# Patient Record
Sex: Male | Born: 1970 | Race: White | Hispanic: No | Marital: Single | Smoking: Current every day smoker
Health system: Southern US, Community
[De-identification: ages and names within clinical notes are randomized; demographics above are authoritative.]

## PROBLEM LIST (undated history)

## (undated) DIAGNOSIS — E669 Obesity, unspecified: Secondary | ICD-10-CM

## (undated) DIAGNOSIS — I10 Essential (primary) hypertension: Secondary | ICD-10-CM

## (undated) DIAGNOSIS — E119 Type 2 diabetes mellitus without complications: Secondary | ICD-10-CM

## (undated) DIAGNOSIS — E7800 Pure hypercholesterolemia, unspecified: Secondary | ICD-10-CM

---

## 1999-01-12 ENCOUNTER — Encounter: Payer: Self-pay | Admitting: Emergency Medicine

## 1999-01-12 ENCOUNTER — Emergency Department (HOSPITAL_COMMUNITY): Admission: EM | Admit: 1999-01-12 | Discharge: 1999-01-12 | Payer: Self-pay | Admitting: Emergency Medicine

## 2003-10-09 ENCOUNTER — Emergency Department (HOSPITAL_COMMUNITY): Admission: EM | Admit: 2003-10-09 | Discharge: 2003-10-09 | Payer: Self-pay

## 2004-07-02 ENCOUNTER — Emergency Department (HOSPITAL_COMMUNITY): Admission: EM | Admit: 2004-07-02 | Discharge: 2004-07-02 | Payer: Self-pay | Admitting: Emergency Medicine

## 2005-04-06 ENCOUNTER — Emergency Department (HOSPITAL_COMMUNITY): Admission: EM | Admit: 2005-04-06 | Discharge: 2005-04-06 | Payer: Self-pay | Admitting: Emergency Medicine

## 2010-12-02 ENCOUNTER — Inpatient Hospital Stay (INDEPENDENT_AMBULATORY_CARE_PROVIDER_SITE_OTHER)
Admission: RE | Admit: 2010-12-02 | Discharge: 2010-12-02 | Disposition: A | Discharge status: Home / Self Care | Payer: Self-pay | Source: Ambulatory Visit | Attending: Emergency Medicine | Admitting: Emergency Medicine

## 2010-12-02 ENCOUNTER — Ambulatory Visit (INDEPENDENT_AMBULATORY_CARE_PROVIDER_SITE_OTHER): Payer: Self-pay

## 2010-12-02 DIAGNOSIS — S82899A Other fracture of unspecified lower leg, initial encounter for closed fracture: Secondary | ICD-10-CM

## 2013-09-28 ENCOUNTER — Emergency Department (HOSPITAL_COMMUNITY)
Admission: EM | Admit: 2013-09-28 | Discharge: 2013-09-28 | Disposition: A | Discharge status: Home / Self Care | Payer: Self-pay | Attending: Emergency Medicine | Admitting: Emergency Medicine

## 2013-09-28 ENCOUNTER — Emergency Department (HOSPITAL_COMMUNITY): Payer: Self-pay

## 2013-09-28 ENCOUNTER — Encounter (HOSPITAL_COMMUNITY): Payer: Self-pay | Admitting: Emergency Medicine

## 2013-09-28 DIAGNOSIS — G4710 Hypersomnia, unspecified: Secondary | ICD-10-CM | POA: Insufficient documentation

## 2013-09-28 DIAGNOSIS — R55 Syncope and collapse: Secondary | ICD-10-CM | POA: Insufficient documentation

## 2013-09-28 DIAGNOSIS — I10 Essential (primary) hypertension: Secondary | ICD-10-CM | POA: Insufficient documentation

## 2013-09-28 DIAGNOSIS — E663 Overweight: Secondary | ICD-10-CM | POA: Insufficient documentation

## 2013-09-28 DIAGNOSIS — F17200 Nicotine dependence, unspecified, uncomplicated: Secondary | ICD-10-CM | POA: Insufficient documentation

## 2013-09-28 HISTORY — DX: Essential (primary) hypertension: I10

## 2013-09-28 LAB — I-STAT CHEM 8, ED
BUN: 15 mg/dL (ref 6–23)
CHLORIDE: 99 meq/L (ref 96–112)
Calcium, Ion: 1.2 mmol/L (ref 1.12–1.23)
Creatinine, Ser: 1.1 mg/dL (ref 0.50–1.35)
Glucose, Bld: 125 mg/dL — ABNORMAL HIGH (ref 70–99)
HEMATOCRIT: 49 % (ref 39.0–52.0)
HEMOGLOBIN: 16.7 g/dL (ref 13.0–17.0)
Potassium: 4.3 mEq/L (ref 3.7–5.3)
Sodium: 139 mEq/L (ref 137–147)
TCO2: 30 mmol/L (ref 0–100)

## 2013-09-28 LAB — PRO B NATRIURETIC PEPTIDE: Pro B Natriuretic peptide (BNP): 45.2 pg/mL (ref 0–125)

## 2013-09-28 LAB — I-STAT TROPONIN, ED: TROPONIN I, POC: 0 ng/mL (ref 0.00–0.08)

## 2013-09-28 MED ORDER — LISINOPRIL 10 MG PO TABS
10.0000 mg | ORAL_TABLET | Freq: Every day | ORAL | Status: DC
Start: 2013-09-28 — End: 2016-03-21

## 2013-09-28 NOTE — ED Notes (Signed)
Over past several weeks pt is having episodes of falling asleep, happening several times a day.  Last episode yesterday.  Pt sleeping well at night, roommates tells him that they hear him gasping for breaths.  No injuries from falling asleep.  Pt states he has gained weight over that past several years.  No other s/s noted.

## 2013-09-28 NOTE — ED Provider Notes (Signed)
CSN: [id number]     Arrival date & time 09/28/13  [id number] History   First MD Initiated Contact with Patient 09/28/13 1107     Chief Complaint  Patient presents with  . Loss of Consciousness     (Consider location/radiation/quality/duration/timing/severity/associated sxs/prior Treatment) HPI Comments: The patient presents to the ER for evaluation of somnolence. Patient reports that he has been frequently falling asleep during the day. He reports that when he is sitting at work or not doing anything in particular, he often falls asleep. This is unusual for him. He thinks he is sleeping well at night. He does report a history of significant weight gain over the last year or so after he had a leg injury. Has gained 75 pounds. He also reports a history of significant snoring, has never been evaluated for this. He is hypertensive, but has never been treated for it. He denies any specific shortness of breath or chest pain.  Patient is a 43 y.o. male presenting with syncope.  Loss of Consciousness Associated symptoms: weakness     Past Medical History  Diagnosis Date  . Hypertension    History reviewed. No pertinent past surgical history. History reviewed. No pertinent family history. History  Substance Use Topics  . Smoking status: Current Every Day Smoker -- 1.00 packs/day    Types: Cigarettes  . Smokeless tobacco: Not on file  . Alcohol Use: Yes    Review of Systems  Cardiovascular: Positive for syncope.  Neurological: Positive for weakness.  Psychiatric/Behavioral: Negative for sleep disturbance.  All other systems reviewed and are negative.     Allergies  Review of patient's allergies indicates no known allergies.  Home Medications   Prior to Admission medications   Not on File   BP 125/93  Pulse 66  Temp(Src) 97.9 F (36.6 C)  Resp 14  Wt 285 lb 3 oz (129.36 kg)  SpO2 92% Physical Exam  Constitutional: He is oriented to person, place, and time. He appears  well-developed and well-nourished. No distress.  HENT:  Head: Normocephalic and atraumatic.  Right Ear: Hearing normal.  Left Ear: Hearing normal.  Nose: Nose normal.  Mouth/Throat: Oropharynx is clear and moist and mucous membranes are normal.  Eyes: Conjunctivae and EOM are normal. Pupils are equal, round, and reactive to light.  Neck: Normal range of motion. Neck supple.  Cardiovascular: Regular rhythm, S1 normal and S2 normal.  Exam reveals no gallop and no friction rub.   No murmur heard. Pulmonary/Chest: Effort normal and breath sounds normal. No respiratory distress. He exhibits no tenderness.  Abdominal: Soft. Normal appearance and bowel sounds are normal. There is no hepatosplenomegaly. There is no tenderness. There is no rebound, no guarding, no tenderness at McBurney's point and negative Murphy's sign. No hernia.  Musculoskeletal: Normal range of motion.  Neurological: He is alert and oriented to person, place, and time. He has normal strength. No cranial nerve deficit or sensory deficit. Coordination normal. GCS eye subscore is 4. GCS verbal subscore is 5. GCS motor subscore is 6.  Skin: Skin is warm, dry and intact. No rash noted. No cyanosis.  Psychiatric: He has a normal mood and affect. His speech is normal and behavior is normal. Thought content normal.    ED Course  Procedures (including critical care time) Labs Review Labs Reviewed  I-STAT CHEM 8, ED - Abnormal; Notable for the following:    Glucose, Bld 125 (*)    All other components within normal limits  PRO B NATRIURETIC PEPTIDE  Rosezena SensorI-STAT TROPOININ, ED    Imaging Review Dg Chest 2 View  09/28/2013   CLINICAL DATA:  Altered mental status  EXAM: CHEST  2 VIEW  COMPARISON:  Oct 09, 2003  FINDINGS: There is no edema or consolidation. Heart size and pulmonary vascularity are normal. No adenopathy. Anterior wedging of the T6 vertebral body is a stable finding.  IMPRESSION: No edema or consolidation.   Electronically  Signed   By: Bretta BangWilliam  Woodruff M.D.   On: 09/28/2013 12:20     EKG Interpretation   Date/Time:  Saturday Sep 28 2013 09:54:32 EDT Ventricular Rate:  91 PR Interval:  128 QRS Duration: 88 QT Interval:  368 QTC Calculation: 452 R Axis:   68 Text Interpretation:  Normal sinus rhythm Normal ECG Confirmed by Kalisa Girtman   MD, Nykia Turko ([id number]) on 09/28/2013 11:24:19 AM      MDM   Final diagnoses:  Hypersomnolence  Syncope  Hypertension   Patient presents to the ER for evaluation of hypersomnolence. He has frequently been falling asleep over last couple of weeks. He does not have any other concomitant symptoms. He has a normal neurologic exam here in the ER. Cardiac workup was negative. Patient does have a large body habitus and admits to taking approximately 75 pounds over last year or so after a leg injury. Sleep apnea is suspected. As his workup here in the ER has been unremarkable other than his hypertension, he will be referred to neurology for further evaluation of hypersomnolence and possible syncope. He was given a prescription for lisinopril for his chronic hypertension and has been untreated. She was also given referral for primary care followup.    Gilda Creasehristopher J. Kijuan Gallicchio, MD 09/28/13 [phone]

## 2013-09-28 NOTE — ED Notes (Signed)
Per pt sts he has been falling asleep intermittently. 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 sts that this has happened every day for the past week.

## 2013-09-28 NOTE — Discharge Instructions (Signed)
Hypertension As your heart beats, it forces blood through your arteries. This force is your blood pressure. If the pressure is too high, it is called hypertension (HTN) or high blood pressure. HTN is dangerous because you may have it and not know it. High blood pressure may mean that your heart has to work harder to pump blood. Your arteries may be narrow or stiff. The extra work puts you at risk for heart disease, stroke, and other problems.  Blood pressure consists of two numbers, a higher number over a lower, 110/72, for example. It is stated as "110 over 72." The ideal is below 120 for the top number (systolic) and under 80 for the bottom (diastolic). Write down your blood pressure today. You should pay close attention to your blood pressure if you have certain conditions such as:  Heart failure.  Prior heart attack.  Diabetes  Chronic kidney disease.  Prior stroke.  Multiple risk factors for heart disease. To see if you have HTN, your blood pressure should be measured while you are seated with your arm held at the level of the heart. It should be measured at least twice. A one-time elevated blood pressure reading (especially in the Emergency Department) does not mean that you need treatment. There may be conditions in which the blood pressure is different between your right and left arms. It is important to see your caregiver soon for a recheck. Most people have essential hypertension which means that there is not a specific cause. This type of high blood pressure may be lowered by changing lifestyle factors such as:  Stress.  Smoking.  Lack of exercise.  Excessive weight.  Drug/tobacco/alcohol use.  Eating less salt. Most people do not have symptoms from high blood pressure until it has caused damage to the body. Effective treatment can often prevent, delay or reduce that damage. TREATMENT  When a cause has been identified, treatment for high blood pressure is directed at the  cause. There are a large number of medications to treat HTN. These fall into several categories, and your caregiver will help you select the medicines that are best for you. Medications may have side effects. You should review side effects with your caregiver. If your blood pressure stays high after you have made lifestyle changes or started on medicines,   Your medication(s) may need to be changed.  Other problems may need to be addressed.  Be certain you understand your prescriptions, and know how and when to take your medicine.  Be sure to follow up with your caregiver within the time frame advised (usually within two weeks) to have your blood pressure rechecked and to review your medications.  If you are taking more than one medicine to lower your blood pressure, make sure you know how and at what times they should be taken. Taking two medicines at the same time can result in blood pressure that is too low. SEEK IMMEDIATE MEDICAL CARE IF:  You develop a severe headache, blurred or changing vision, or confusion.  You have unusual weakness or numbness, or a faint feeling.  You have severe chest or abdominal pain, vomiting, or breathing problems. MAKE SURE YOU:   Understand these instructions.  Will watch your condition.  Will get help right away if you are not doing well or get worse. Document Released: 05/02/2005 Document Revised: 07/25/2011 Document Reviewed: 12/21/2007 [facility] Patient Information 2014 [place], [place].  Hypersomnia Hypersomnia usually brings recurrent episodes of excessive daytime sleepiness or prolonged nighttime sleep. It  is different than feeling tired due to lack of or interrupted sleep at night. People with hypersomnia are compelled to nap repeatedly during the day. This is often at inappropriate times such as:  At work.  During a meal.  In conversation. These daytime naps usually provide no relief. This disorder typically affects adolescents and young  adults. CAUSES  This condition may be caused by:  Another sleep disorder (such as narcolepsy or sleep apnea).  Dysfunction of the autonomic nervous system.  Drug or alcohol abuse.  A physical problem, such as:  A tumor.  Head trauma. This is damage caused by an accident.  Injury to the central nervous system.  Certain medications, or medicine withdrawal.  Medical conditions may contribute to the disorder, including:  Multiple sclerosis.  Depression.  Encephalitis.  Epilepsy.  Obesity.  Some people appear to have a genetic predisposition to this disorder. In others, there is no known cause. SYMPTOMS   Patients often have difficulty waking from a long sleep. They may feel dazed or confused.  Other symptoms may include:  Anxiety.  Increased irritation (inflammation).  Decreased energy.  Restlessness.  Slow thinking.  Slow speech.  Loss of appetite.  Hallucinations.  Memory difficulty.  Tremors, Tics.  Some patients lose the ability to function in family, social, occupational, or other settings. TREATMENT  Treatment is symptomatic in nature. Stimulants and other drugs may be used to treat this disorder. Changes in behavior may help. For example, avoid night work and social activities that delay bed time. Changes in diet may offer some relief. Patients should avoid alcohol and caffeine. PROGNOSIS  The likely outcome (prognosis) for persons with hypersomnia depends on the cause of the disorder. The disorder itself is not life threatening. But it can have serious consequences. For example, automobile accidents can be caused by falling asleep while driving. The attacks usually continue indefinitely. Document Released: 04/22/2002 Document Revised: 07/25/2011 Document Reviewed: 03/26/2008 [facility] Patient Information 2014 [place], [place].

## 2014-03-17 ENCOUNTER — Emergency Department (HOSPITAL_COMMUNITY): Payer: Self-pay

## 2014-03-17 ENCOUNTER — Emergency Department (HOSPITAL_COMMUNITY)
Admission: EM | Admit: 2014-03-17 | Discharge: 2014-03-17 | Disposition: A | Discharge status: Home / Self Care | Payer: Self-pay | Attending: Emergency Medicine | Admitting: Emergency Medicine

## 2014-03-17 ENCOUNTER — Encounter (HOSPITAL_COMMUNITY): Payer: Self-pay | Admitting: Emergency Medicine

## 2014-03-17 DIAGNOSIS — R05 Cough: Secondary | ICD-10-CM | POA: Insufficient documentation

## 2014-03-17 DIAGNOSIS — Z79899 Other long term (current) drug therapy: Secondary | ICD-10-CM | POA: Insufficient documentation

## 2014-03-17 DIAGNOSIS — I10 Essential (primary) hypertension: Secondary | ICD-10-CM | POA: Insufficient documentation

## 2014-03-17 DIAGNOSIS — R079 Chest pain, unspecified: Secondary | ICD-10-CM

## 2014-03-17 DIAGNOSIS — R000 Tachycardia, unspecified: Secondary | ICD-10-CM | POA: Insufficient documentation

## 2014-03-17 DIAGNOSIS — R0789 Other chest pain: Secondary | ICD-10-CM | POA: Insufficient documentation

## 2014-03-17 DIAGNOSIS — Z720 Tobacco use: Secondary | ICD-10-CM | POA: Insufficient documentation

## 2014-03-17 LAB — CBC WITH DIFFERENTIAL/PLATELET
BASOS ABS: 0 10*3/uL (ref 0.0–0.1)
Basophils Relative: 0 % (ref 0–1)
EOS ABS: 0.2 10*3/uL (ref 0.0–0.7)
Eosinophils Relative: 2 % (ref 0–5)
HEMATOCRIT: 57.7 % — AB (ref 39.0–52.0)
Hemoglobin: 16.5 g/dL (ref 13.0–17.0)
Lymphocytes Relative: 16 % (ref 12–46)
Lymphs Abs: 1.7 10*3/uL (ref 0.7–4.0)
MCH: 31.1 pg (ref 26.0–34.0)
MCHC: 28.6 g/dL — ABNORMAL LOW (ref 30.0–36.0)
MCV: 108.7 fL — AB (ref 78.0–100.0)
MONO ABS: 0.7 10*3/uL (ref 0.1–1.0)
Monocytes Relative: 7 % (ref 3–12)
Neutro Abs: 7.6 10*3/uL (ref 1.7–7.7)
Neutrophils Relative %: 74 % (ref 43–77)
PLATELETS: 242 10*3/uL (ref 150–400)
RBC: 5.31 MIL/uL (ref 4.22–5.81)
RDW: 14.2 % (ref 11.5–15.5)
WBC: 10.2 10*3/uL (ref 4.0–10.5)

## 2014-03-17 LAB — I-STAT TROPONIN, ED
Troponin i, poc: 0.01 ng/mL (ref 0.00–0.08)
Troponin i, poc: 0.02 ng/mL (ref 0.00–0.08)

## 2014-03-17 LAB — BASIC METABOLIC PANEL
Anion gap: 13 (ref 5–15)
BUN: 16 mg/dL (ref 6–23)
CO2: 27 mEq/L (ref 19–32)
CREATININE: 0.84 mg/dL (ref 0.50–1.35)
Calcium: 9.5 mg/dL (ref 8.4–10.5)
Chloride: 98 mEq/L (ref 96–112)
GFR calc Af Amer: >90 mL/min (ref >90)
Glucose, Bld: 117 mg/dL — ABNORMAL HIGH (ref 70–99)
Potassium: 4.4 mEq/L (ref 3.7–5.3)
Sodium: 138 mEq/L (ref 137–147)

## 2014-03-17 LAB — D-DIMER, QUANTITATIVE (NOT AT ARMC): D DIMER QUANT: 0.55 ug{FEU}/mL — AB (ref 0.00–0.48)

## 2014-03-17 MED ORDER — IOHEXOL 350 MG/ML SOLN
100.0000 mL | Freq: Once | INTRAVENOUS | Status: AC | PRN
  Administered 2014-03-17: 100 mL via INTRAVENOUS

## 2014-03-17 MED ORDER — IBUPROFEN 800 MG PO TABS
800.0000 mg | ORAL_TABLET | Freq: Once | ORAL | Status: AC
  Administered 2014-03-17: 800 mg via ORAL
  Filled 2014-03-17: qty 1

## 2014-03-17 MED ORDER — HYDROCODONE-ACETAMINOPHEN 5-325 MG PO TABS: 1.0000 | ORAL_TABLET | Freq: Four times a day (QID) | ORAL | Status: DC | PRN

## 2014-03-17 NOTE — ED Provider Notes (Signed)
CSN: [id number]     Arrival date & time 03/17/14  0913 History   First MD Initiated Contact with Patient 03/17/14 [phone]     Chief Complaint  Patient presents with  . Cough  . Chest Pain     (Consider location/radiation/quality/duration/timing/severity/associated sxs/prior Treatment) Patient is a 43 y.o. male presenting with cough and chest pain. The history is provided by the patient.  Cough Cough characteristics:  Non-productive Severity:  Mild Onset quality:  Gradual Timing:  Intermittent Progression:  Unchanged Chronicity:  Recurrent Smoker: yes   Context: not sick contacts and not smoke exposure   Relieved by:  Nothing Worsened by:  Deep breathing Ineffective treatments:  None tried Associated symptoms: chest pain   Associated symptoms: no fever, no rash, no rhinorrhea and no shortness of breath   Chest Pain Associated symptoms: cough (mild)   Associated symptoms: no abdominal pain, no fever, no shortness of breath and not vomiting     Past Medical History  Diagnosis Date  . Hypertension    History reviewed. No pertinent past surgical history. History reviewed. No pertinent family history. History  Substance Use Topics  . Smoking status: Current Every Day Smoker -- 1.00 packs/day    Types: Cigarettes  . Smokeless tobacco: Not on file  . Alcohol Use: Yes    Review of Systems  Constitutional: Negative for fever.  HENT: Negative for rhinorrhea.   Respiratory: Positive for cough (mild). Negative for shortness of breath.   Cardiovascular: Positive for chest pain.  Gastrointestinal: Negative for vomiting and abdominal pain.  Skin: Negative for rash.  All other systems reviewed and are negative.     Allergies  Review of patient's allergies indicates no known allergies.  Home Medications   Prior to Admission medications   Medication Sig Start Date End Date Taking? Authorizing Provider  ibuprofen (ADVIL,MOTRIN) 200 MG tablet Take 400 mg by mouth every 6 (six)  hours as needed (pain).   Yes Historical Provider, MD  lisinopril (PRINIVIL) 10 MG tablet Take 1 tablet (10 mg total) by mouth daily. Patient not taking: Reported on 03/17/2014 09/28/13   Gilda Creasehristopher J. Pollina, MD   BP 156/91 mmHg  Pulse 107  Temp(Src) 98.9 F (37.2 C) (Oral)  Resp 22  Ht 5\' 10"  (1.778 m)  Wt 275 lb (124.739 kg)  BMI 39.46 kg/m2  SpO2 93% Physical Exam  Constitutional: He is oriented to person, place, and time. He appears well-developed and well-nourished. No distress.  HENT:  Head: Normocephalic and atraumatic.  Mouth/Throat: Oropharynx is clear and moist. No oropharyngeal exudate.  Eyes: EOM are normal. Pupils are equal, round, and reactive to light.  Neck: Normal range of motion. Neck supple.  Cardiovascular: Normal rate and regular rhythm.  Exam reveals no friction rub.   No murmur heard. Pulmonary/Chest: Effort normal and breath sounds normal. No respiratory distress. He has no wheezes. He has no rales. He exhibits tenderness (L anterior chest pain).  Abdominal: Soft. He exhibits no distension. There is no tenderness. There is no rebound.  Musculoskeletal: Normal range of motion. He exhibits no edema.  Neurological: He is alert and oriented to person, place, and time. He exhibits normal muscle tone.  Skin: No rash noted. He is not diaphoretic.    ED Course  Procedures (including critical care time) Labs Review Labs Reviewed  BASIC METABOLIC PANEL  CBC WITH DIFFERENTIAL  D-DIMER, QUANTITATIVE  Rosezena SensorI-STAT TROPOININ, ED    Imaging Review Dg Chest 2 View  03/17/2014   CLINICAL DATA:  Three day history of left-sided chest pain, shortness of breath, and cough  EXAM: CHEST  2 VIEW  COMPARISON:  PA and lateral chest of Sep 28, 2013  FINDINGS: The lungs are adequately inflated. The interstitial markings are coarse but stable. There is no alveolar infiltrate. The heart and pulmonary vascularity are normal. There is no pleural effusion. The trachea is midline. The bony  thorax is unremarkable.  IMPRESSION: There is no evidence of pneumonia, CHF, nor other acute cardiopulmonary abnormality. Coarse interstitial lung markings likely reflect the patient's smoking history.   Electronically Signed   By: Lonnie  SwazilandJordan   On: 03/17/2014 10:45   Ct Angio Chest Pe W/cm &/or Wo Cm  03/17/2014   CLINICAL DATA:  Chest pain and shortness of breath for 3 days  EXAM: CT ANGIOGRAPHY CHEST WITH CONTRAST  TECHNIQUE: Multidetector CT imaging of the chest was performed using the standard protocol during bolus administration of intravenous contrast. Multiplanar CT image reconstructions and MIPs were obtained to evaluate the vascular anatomy.  CONTRAST:  100mL OMNIPAQUE IOHEXOL 350 MG/ML SOLN  COMPARISON:  Chest radiograph March 17, 2014 ; CT angiogram chest May 19, 2009  FINDINGS: There is no demonstrable pulmonary embolus. There is no thoracic aortic aneurysm or dissection.  Lungs are clear. There is no appreciable thoracic adenopathy. Pericardium is not thickened.  In the visualized upper abdomen there is hepatic steatosis. There is a stable left adrenal mass consistent with an adenoma. This adrenal mass measures 1.9 x 1.5 cm. There are no blastic or lytic bone lesions.  Review of the MIP images confirms the above findings.  IMPRESSION: No demonstrable pulmonary embolus. No edema or consolidation. Hepatic steatosis. Stable left adrenal adenoma.   Electronically Signed   By: Bretta BangWilliam  Woodruff M.D.   On: 03/17/2014 13:20     EKG Interpretation   Date/Time:  Monday March 17 2014 09:19:18 EST Ventricular Rate:  107 PR Interval:  126 QRS Duration: 88 QT Interval:  332 QTC Calculation: 443 R Axis:   55 Text Interpretation:  Sinus tachycardia Cannot rule out Anterior infarct ,  age undetermined Similar to prior Confirmed by [facility] HospitalWALDEN  MD, Cherylee Rawlinson ([id number]) on  03/17/2014 9:28:09 AM      MDM   Final diagnoses:  Left sided chest pain    50M here with CP. Began last week when coughing  after choking on a pretzel. This was worse with movement, coughing. This improved. He now has had L sided chest pain that began at rest, not worse with movement, but it having pain with deep breaths. No other chest pain. No further coughing, no fevers, no vomiting. Tachycardic here. L anterior chest point tenderness. Since musculoskeletal type pain improved and now having pain at rest, will check labs including D-dimer, cannot PERC him b/c he is a smoker and is tachycardic.  Negative PE scan after positive dimer. Stable for discharge.  Elwin MochaBlair Maysun Meditz, MD 03/17/14 [phone]

## 2014-03-17 NOTE — ED Notes (Signed)
Patient transported to CT 

## 2014-03-17 NOTE — Discharge Instructions (Signed)
Chest Pain (Nonspecific) °It is often hard to give a specific diagnosis for the cause of chest pain. There is always a chance that your pain could be related to something serious, such as a heart attack or a blood clot in the lungs. You need to follow up with your health care provider for further evaluation. °CAUSES  °· Heartburn. °· Pneumonia or bronchitis. °· Anxiety or stress. °· Inflammation around your heart (pericarditis) or lung (pleuritis or pleurisy). °· A blood clot in the lung. °· A collapsed lung (pneumothorax). It can develop suddenly on its own (spontaneous pneumothorax) or from trauma to the chest. °· Shingles infection (herpes zoster virus). °The chest wall is composed of bones, muscles, and cartilage. Any of these can be the source of the pain. °· The bones can be bruised by injury. °· The muscles or cartilage can be strained by coughing or overwork. °· The cartilage can be affected by inflammation and become sore (costochondritis). °DIAGNOSIS  °Lab tests or other studies may be needed to find the cause of your pain. Your health care provider may have you take a test called an ambulatory electrocardiogram (ECG). An ECG records your heartbeat patterns over a 24-hour period. You may also have other tests, such as: °· Transthoracic echocardiogram (TTE). During echocardiography, sound waves are used to evaluate how blood flows through your heart. °· Transesophageal echocardiogram (TEE). °· Cardiac monitoring. This allows your health care provider to monitor your heart rate and rhythm in real time. °· Holter monitor. This is a portable device that records your heartbeat and can help diagnose heart arrhythmias. It allows your health care provider to track your heart activity for several days, if needed. °· Stress tests by exercise or by giving medicine that makes the heart beat faster. °TREATMENT  °· Treatment depends on what may be causing your chest pain. Treatment may include: °¨ Acid blockers for  heartburn. °¨ Anti-inflammatory medicine. °¨ Pain medicine for inflammatory conditions. °¨ Antibiotics if an infection is present. °· You may be advised to change lifestyle habits. This includes stopping smoking and avoiding alcohol, caffeine, and chocolate. °· You may be advised to keep your head raised (elevated) when sleeping. This reduces the chance of acid going backward from your stomach into your esophagus. °Most of the time, nonspecific chest pain will improve within 2-3 days with rest and mild pain medicine.  °HOME CARE INSTRUCTIONS  °· If antibiotics were prescribed, take them as directed. Finish them even if you start to feel better. °· For the next few days, avoid physical activities that bring on chest pain. Continue physical activities as directed. °· Do not use any tobacco products, including cigarettes, chewing tobacco, or electronic cigarettes. °· Avoid drinking alcohol. °· Only take medicine as directed by your health care provider. °· Follow your health care provider's suggestions for further testing if your chest pain does not go away. °· Keep any follow-up appointments you made. If you do not go to an appointment, you could develop lasting (chronic) problems with pain. If there is any problem keeping an appointment, call to reschedule. °SEEK MEDICAL CARE IF:  °· Your chest pain does not go away, even after treatment. °· You have a rash with blisters on your chest. °· You have a fever. °SEEK IMMEDIATE MEDICAL CARE IF:  °· You have increased chest pain or pain that spreads to your arm, neck, jaw, back, or abdomen. °· You have shortness of breath. °· You have an increasing cough, or you cough   up blood. °· You have severe back or abdominal pain. °· You feel nauseous or vomit. °· You have severe weakness. °· You faint. °· You have chills. °This is an emergency. Do not wait to see if the pain will go away. Get medical help at once. Call your local emergency services (911 in [place] Do not drive  yourself to the hospital. °MAKE SURE YOU:  °· Understand these instructions. °· Will watch your condition. °· Will get help right away if you are not doing well or get worse. °Document Released: 02/09/2005 Document Revised: 05/07/2013 Document Reviewed: 12/06/2007 °ExitCare® Patient Information ©2015 [employer]. This information is not intended to replace advice given to you by your health care provider. Make sure you discuss any questions you have with your health care provider. ° ° °Emergency Department Resource Guide °1) Find a Doctor and Pay Out of Pocket °Although you won't have to find out who is covered by your insurance plan, it is a good idea to ask around and get recommendations. You will then need to call the office and see if the doctor you have chosen will accept you as a new patient and what types of options they offer for patients who are self-pay. Some doctors offer discounts or will set up payment plans for their patients who do not have insurance, but you will need to ask so you aren't surprised when you get to your appointment. ° °2) Contact Your Local Health Department °Not all health departments have doctors that can see patients for sick visits, but many do, so it is worth a call to see if yours does. If you don't know where your local health department is, you can check in your phone book. The CDC also has a tool to help you locate your state's health department, and many state websites also have listings of all of their local health departments. ° °3) Find a Walk-in Clinic °If your illness is not likely to be very severe or complicated, you may want to try a walk in clinic. These are popping up all over the country in pharmacies, drugstores, and shopping centers. They're usually staffed by nurse practitioners or physician assistants that have been trained to treat common illnesses and complaints. They're usually fairly quick and inexpensive. However, if you have serious medical issues or  chronic medical problems, these are probably not your best option. ° °No Primary Care Doctor: °- Call [facility] at  832-8000 - they can help you locate a primary care doctor that  accepts your insurance, provides certain services, etc. °- Physician Referral Service- 1-800-533-3463 ° °Chronic Pain Problems: °Organization         Address  Phone   Notes  °[facility]  (336) 297-2271 Patients need to be referred by their primary care doctor.  ° °Medication Assistance: °Organization         Address  Phone   Notes  °[place] [facility] [address] °, [place] [address] (336) 641-8030 --Must be a resident of [place] °-- Must have NO insurance coverage whatsoever (no Medicaid/ Medicare, etc.) °-- The pt. MUST have a primary care doctor that directs their care regularly and follows them in the community °  °[facility]  (866) 331-1348   °[employer]  (888) 892-1162   ° °Agencies that provide inexpensive medical care: °Organization         Address  Phone   Notes  °[facility]  (  336) 832-8035   °[facility]    (336) 832-7272   °[facility] [address] °[place], [place] [address] (336) 832-4777   °[facility] [address], °[place] (336) 271-4999   °[facility]    (336) 373-0678   °[facility]    (336) 272-1050   °[facility] ° [address], [place] Phone:  (336) 832-4444, Fax:  (336) 832-4440 Hours of Operation:  9 am - 6 pm, M-F.  Also accepts Medicaid/Medicare and self-pay.  °[facility] ° [address], [place] Phone: (336) 832-3150, Fax: (336) [phone]. Hours of Operation:  8:30 am - 5:30 pm, M-F.  Also accepts Medicaid and self-pay.  °[facility] [place] [address], [place] Phone: (336) 878-6027   °[facility] [address], [place], [place]  (336)723-1848, Ext. 123 Mondays & Thursdays: 7-9 AM.  First 15 patients are seen on a first come, first serve basis. °  ° °Medicaid-accepting [place] Providers: ° °Organization         Address  Phone   Notes  °[facility] [address], [place] (336) 641-2100 Also accepts self-pay patients.  °[facility] [address], [place] ° (336) 856-9996   °[facility] [address], [place] (336) 288-8857   °[facility] [address] [place] [address], [place] (336) 299-7000   °Veita Bland [address], [place]  ° (336) 373-1557 Only accepts [place] Access Medicaid patients after they have their name applied to their card.  ° °Self-Pay (no insurance) in [place]: ° °Organization         Address  Phone   Notes  °Sickle Cell Patients, [facility] [address], [place] (336) 832-1970   °[facility] [address], [place] (336) 832-4400   °[facility] [place] ° [address], [place] (336) 992-4800   °[facility]/Dr. Osei-Bonsu ° [address] [place] [address], [place] or [address], [place] (336) 841-8500 Phone number for both [place] and [place] locations is the same.  °[facility] [address], [place] (336) 299-0000   °[facility] [address] [place] [address], [place] or [address] (336) 852-7530 °(336) 878-2260   °[facility] [address], [place] (336) 350-1642, phone; (336) 294-5005, fax Sees patients 1st and 3rd Saturday of every month.  Must not qualify for public or private insurance (i.e. Medicaid, Medicare, [place] Health Choice, Veterans' Benefits) • Household income should be no more than 200% of the poverty level •The clinic cannot treat you if you are pregnant or think you are pregnant • Sexually transmitted  diseases are not treated at the clinic.  ° ° °Dental Care: °Organization         Address  Phone  Notes  °[facility] [address],  (336) 641-6152 Accepts children up to age 21 who are enrolled in Medicaid or [place] Health Choice; pregnant women with a Medicaid card; and children who have applied for Medicaid or [place] Health Choice, but were declined, whose parents can pay a reduced fee at time of service.  °[facility] [place]    [address], [place] (336) 641-7733 Accepts children up to age 21 who are enrolled in Medicaid or [place] Health Choice; pregnant women with a Medicaid card; and children who have applied for Medicaid or [place] Health Choice, but were declined, whose parents can pay a reduced fee at time of service.  °[facility] ° [address], [place] (336) 641-4533 Patients are seen by appointment only. Walk-ins are not accepted. [facility] will see patients 18 years of age and older. °Monday - Tuesday (8am-5pm) °Most Wednesdays (8:30-5pm) °$30 per visit, cash only  °[facility] ° [address], [place] (336) 641-4533 Patients are seen by appointment only. Walk-ins are not accepted. [facility] will see patients 18 years of age and older. °One Wednesday Evening (Monthly: Volunteer Based).  $30 per visit, cash only  °[facility]  (919) 537-3737 for adults; Children under age 4, call Graduate Pediatric Dentistry at ([phone]) [phone]. Children aged 4-14, please call ([phone]) [phone] to request a pediatric application. ° Dental services are provided in all areas of dental care including fillings, crowns and bridges, complete and partial dentures, implants, gum treatment, root canals, and extractions. Preventive care is also provided. Treatment is provided to both adults and children. °Patients are selected via a  lottery and there is often a waiting list. °  °[facility] [address], °[place] ° (336) 763-8833 [email or web address] °  °[facility] [address], [place], [place] (336)723-1848, Ext. 123 Second and Fourth Thursday of each month, opens at 6:30 AM; Clinic ends at 9 AM.  Patients are seen on a first-come first-served basis, and a limited number are seen during each clinic.  ° °[facility] ° [address], [place], [place] (336) 723-7904   Eligibility Requirements °You must have lived in [place], [place], or [place] counties for at least the last three months. °  You cannot be eligible for state or federal sponsored healthcare insurance, including Veterans Administration, Medicaid, or Medicare. °  You generally cannot be eligible for healthcare insurance through your employer.  °  How to apply: °Eligibility screenings are held every Tuesday and Wednesday afternoon from 1:00 pm until 4:00 pm. You do not need an appointment for the interview!  °[facility] [address], [place], [place] 336-631-2330   °[facility]  336-342-8273   °[facility]  336-703-3100   °[facility]  336-570-6415   ° °Behavioral Health Resources in the Community: °Intensive Outpatient Programs °Organization         Address  Phone  Notes  °[facility] [address], [place], [place] 336-878-6098   °[facility] [address], [place], [place] 336-832-9800   °[facility] [address], [place], [place] ° 336-882-2125   °[facility] [address],  °[place], [place] 1-800-853-5163 or 336-641-4981   °Substance Abuse Resources °Organization         Address  Phone  Notes  °[facility]  336-882-2125   °[facility]  336-784-9470   °[facility]  336-285-9073   °[facility]  336-845-3988   °Residential &  Outpatient Substance Abuse Program  1-800-659-3381   °Psychological Services °Organization         Address  Phone  Notes  ° Health  336- 832-9600   °  [facility]  336- 378-7881   °[facility] [address], [place] 1-800-853-5163 or 336-641-4981   ° °Mobile Crisis Teams °Organization         Address  Phone  Notes  °[facility], Mobile Crisis Care Unit  1-877-626-1772   °Assertive °[facility] ° [address] [place], [place] 336-834-9664   °Sharon DeEsch [address] °[place] 336-554-5454   ° °Self-Help/Support Groups °Organization         Address  Phone             Notes  °[facility] - variety of support groups  336- 373-1402 Call for more information  °Narcotics Anonymous (NA), [facility] [address], °[place]  2 meetings at this location  ° °Residential Treatment Programs °Organization         Address  Phone  Notes  °[facility] [address],    °[place]  1-866-801-8205   °[facility] ° [address], [place], [place] 704-293-8524   °[facility] [address], [place] 336-845-3988 Admissions: 8am-3pm M-F  °[facility] [address]    °[place], [place] 336-841-1104   °[facility] [address], [place], [place] 336-379-7146   °[facility] [address]  °[place], [place] 336-285-9073   °[facility] - Intensive Outpatient [address], [place], [place] 336-852-3033   °[facility] [address]  °[place], [place] 1-877-615-2722 or 336-784-9470   °[facility] [address] [place], [place] 336-227-7417 Accepts Medicaid  °[facility] [address]  °[place] 1-800-659-3381 Substance Abuse/Addiction Treatment  ° °[place] Behavioral Health Resources °Organization          Address  Phone  Notes  °[facility]  (888) 581-9988   °Julie Brannon, PhD [address] [place], [place]   (336) 349-5553 or (336) 951-0000   °[facility]   [address] °[place], [place] (336) 349-4454   °[facility] [address], [place], [place] (336) 342-8316 Insurance/Medicaid/sponsorship through [facility]  °[facility] [address]                                    [place], [place] (336) 342-8316 Therapy/tele-psych/case  °[facility] [address]  ° [place], [place] (336) 349-2233    °Dr. Arfeen  (336) 349-4544   °[facility] [place]  [employer] [place] [facility] 1) [address], [place] °2) [address], [place] °3)  [address], [place] (336) 349-3220 °(336) 342-7768 ° °(336) 342-8140   °[place] [facility] (336) 342-1394 or (336) 342-3537 (After Hours)    ° ° ° °

## 2014-03-17 NOTE — ED Notes (Signed)
Patient transported to X-ray 

## 2014-03-17 NOTE — ED Notes (Signed)
Pt c/o cough with pain in chest with cough and SOB x 3 days

## 2015-01-12 ENCOUNTER — Emergency Department (HOSPITAL_COMMUNITY)
Admission: EM | Admit: 2015-01-12 | Discharge: 2015-01-12 | Disposition: A | Discharge status: Home / Self Care | Payer: Self-pay | Attending: Emergency Medicine | Admitting: Emergency Medicine

## 2015-01-12 DIAGNOSIS — I10 Essential (primary) hypertension: Secondary | ICD-10-CM | POA: Insufficient documentation

## 2015-01-12 DIAGNOSIS — K429 Umbilical hernia without obstruction or gangrene: Secondary | ICD-10-CM | POA: Insufficient documentation

## 2015-01-12 DIAGNOSIS — Z720 Tobacco use: Secondary | ICD-10-CM | POA: Insufficient documentation

## 2015-01-12 DIAGNOSIS — R739 Hyperglycemia, unspecified: Secondary | ICD-10-CM | POA: Insufficient documentation

## 2015-01-12 LAB — URINALYSIS, ROUTINE W REFLEX MICROSCOPIC
Glucose, UA: NEGATIVE mg/dL
Hgb urine dipstick: NEGATIVE
Ketones, ur: 15 mg/dL — AB
LEUKOCYTES UA: NEGATIVE
Nitrite: NEGATIVE
PROTEIN: NEGATIVE mg/dL
Specific Gravity, Urine: 1.027 (ref 1.005–1.030)
Urobilinogen, UA: 0.2 mg/dL (ref 0.0–1.0)
pH: 5.5 (ref 5.0–8.0)

## 2015-01-12 LAB — COMPREHENSIVE METABOLIC PANEL
ALBUMIN: 3.2 g/dL — AB (ref 3.5–5.0)
ALT: 24 U/L (ref 17–63)
AST: 20 U/L (ref 15–41)
Alkaline Phosphatase: 67 U/L (ref 38–126)
Anion gap: 6 (ref 5–15)
BUN: 6 mg/dL (ref 6–20)
CHLORIDE: 101 mmol/L (ref 101–111)
CO2: 30 mmol/L (ref 22–32)
Calcium: 9.2 mg/dL (ref 8.9–10.3)
Creatinine, Ser: 0.86 mg/dL (ref 0.61–1.24)
GFR calc non Af Amer: >60 mL/min (ref >60)
Glucose, Bld: 151 mg/dL — ABNORMAL HIGH (ref 65–99)
Potassium: 4.1 mmol/L (ref 3.5–5.1)
SODIUM: 137 mmol/L (ref 135–145)
Total Bilirubin: 0.9 mg/dL (ref 0.3–1.2)
Total Protein: 6.2 g/dL — ABNORMAL LOW (ref 6.5–8.1)

## 2015-01-12 LAB — CBC WITH DIFFERENTIAL/PLATELET
BASOS ABS: 0 10*3/uL (ref 0.0–0.1)
Basophils Relative: 0 % (ref 0–1)
EOS ABS: 0.4 10*3/uL (ref 0.0–0.7)
EOS PCT: 3 % (ref 0–5)
HCT: 49.3 % (ref 39.0–52.0)
HEMOGLOBIN: 16.8 g/dL (ref 13.0–17.0)
Lymphocytes Relative: 13 % (ref 12–46)
Lymphs Abs: 1.9 10*3/uL (ref 0.7–4.0)
MCH: 29.9 pg (ref 26.0–34.0)
MCHC: 34.1 g/dL (ref 30.0–36.0)
MCV: 87.7 fL (ref 78.0–100.0)
Monocytes Absolute: 0.8 10*3/uL (ref 0.1–1.0)
Monocytes Relative: 5 % (ref 3–12)
NEUTROS PCT: 79 % — AB (ref 43–77)
Neutro Abs: 11.5 10*3/uL — ABNORMAL HIGH (ref 1.7–7.7)
PLATELETS: 289 10*3/uL (ref 150–400)
RBC: 5.62 MIL/uL (ref 4.22–5.81)
RDW: 14.1 % (ref 11.5–15.5)
WBC: 14.6 10*3/uL — AB (ref 4.0–10.5)

## 2015-01-12 LAB — LIPASE, BLOOD: Lipase: 17 U/L — ABNORMAL LOW (ref 22–51)

## 2015-01-12 MED ORDER — DICYCLOMINE HCL 20 MG PO TABS: 20.0000 mg | ORAL_TABLET | Freq: Two times a day (BID) | ORAL | Status: DC

## 2015-01-12 NOTE — Discharge Instructions (Signed)
Your blood sugar was elevated today and for the last 3 visits.  You will need to follow up with the health and wellness center for further testing as this may indicate that you have prediabetes or diabetes. Your blood pressure is elevated. This may be because you have an underlying diagnosis of sleep apnea.   Abdominal (belly) pain can be caused by many things. Your caregiver performed an examination and possibly ordered blood/urine tests and imaging (CT scan, x-rays, ultrasound). Many cases can be observed and treated at home after initial evaluation in the emergency department. Even though you are being discharged home, abdominal pain can be unpredictable. Therefore, you need a repeated exam if your pain does not resolve, returns, or worsens. Most patients with abdominal pain don't have to be admitted to the hospital or have surgery, but serious problems like appendicitis and gallbladder attacks can start out as nonspecific pain. Many abdominal conditions cannot be diagnosed in one visit, so follow-up evaluations are very important. SEEK IMMEDIATE MEDICAL ATTENTION IF: The pain does not go away or becomes severe.  A temperature above 101 develops.  Repeated vomiting occurs (multiple episodes).  The pain becomes localized to portions of the abdomen. The right side could possibly be appendicitis. In an adult, the left lower portion of the abdomen could be colitis or diverticulitis.  Blood is being passed in stools or vomit (bright red or black tarry stools).  Return also if you develop chest pain, difficulty breathing, dizziness or fainting, or become confused, poorly responsive, or inconsolable (young children).    Hyperglycemia Hyperglycemia occurs when the glucose (sugar) in your blood is too high. Hyperglycemia can happen for many reasons, but it most often happens to people who do not know they have diabetes or are not managing their diabetes properly.  CAUSES  Whether you have diabetes or  not, there are other causes of hyperglycemia. Hyperglycemia can occur when you have diabetes, but it can also occur in other situations that you might not be as aware of, such as: Diabetes  If you have diabetes and are having problems controlling your blood glucose, hyperglycemia could occur because of some of the following reasons:  Not following your meal plan.  Not taking your diabetes medications or not taking it properly.  Exercising less or doing less activity than you normally do.  Being sick. Pre-diabetes  This cannot be ignored. Before people develop Type 2 diabetes, they almost always have "pre-diabetes." This is when your blood glucose levels are higher than normal, but not yet high enough to be diagnosed as diabetes. Research has shown that some long-term damage to the body, especially the heart and circulatory system, may already be occurring during pre-diabetes. If you take action to manage your blood glucose when you have pre-diabetes, you may delay or prevent Type 2 diabetes from developing. Stress  If you have diabetes, you may be "diet" controlled or on oral medications or insulin to control your diabetes. However, you may find that your blood glucose is higher than usual in the hospital whether you have diabetes or not. This is often referred to as "stress hyperglycemia." Stress can elevate your blood glucose. This happens because of hormones put out by the body during times of stress. If stress has been the cause of your high blood glucose, it can be followed regularly by your caregiver. That way he/she can make sure your hyperglycemia does not continue to get worse or progress to diabetes. Steroids  Steroids are medications that  act on the infection fighting system (immune system) to block inflammation or infection. One side effect can be a rise in blood glucose. Most people can produce enough extra insulin to allow for this rise, but for those who cannot, steroids make blood  glucose levels go even higher. It is not unusual for steroid treatments to "uncover" diabetes that is developing. It is not always possible to determine if the hyperglycemia will go away after the steroids are stopped. A special blood test called an A1c is sometimes done to determine if your blood glucose was elevated before the steroids were started. SYMPTOMS  Thirsty.  Frequent urination.  Dry mouth.  Blurred vision.  Tired or fatigue.  Weakness.  Sleepy.  Tingling in feet or leg. DIAGNOSIS  Diagnosis is made by monitoring blood glucose in one or all of the following ways:  A1c test. This is a chemical found in your blood.  Fingerstick blood glucose monitoring.  Laboratory results. TREATMENT  First, knowing the cause of the hyperglycemia is important before the hyperglycemia can be treated. Treatment may include, but is not be limited to:  Education.  Change or adjustment in medications.  Change or adjustment in meal plan.  Treatment for an illness, infection, etc.  More frequent blood glucose monitoring.  Change in exercise plan.  Decreasing or stopping steroids.  Lifestyle changes. HOME CARE INSTRUCTIONS   Test your blood glucose as directed.  Exercise regularly. Your caregiver will give you instructions about exercise. Pre-diabetes or diabetes which comes on with stress is helped by exercising.  Eat wholesome, balanced meals. Eat often and at regular, fixed times. Your caregiver or nutritionist will give you a meal plan to guide your sugar intake.  Being at an ideal weight is important. If needed, losing as little as 10 to 15 pounds may help improve blood glucose levels. SEEK MEDICAL CARE IF:   You have questions about medicine, activity, or diet.  You continue to have symptoms (problems such as increased thirst, urination, or weight gain). SEEK IMMEDIATE MEDICAL CARE IF:   You are vomiting or have diarrhea.  Your breath smells fruity.  You are  breathing faster or slower.  You are very sleepy or incoherent.  You have numbness, tingling, or pain in your feet or hands.  You have chest pain.  Your symptoms get worse even though you have been following your caregiver's orders.  If you have any other questions or concerns. Document Released: 10/26/2000 Document Revised: 07/25/2011 Document Reviewed: 08/29/2011 [facility] Patient Information 2015 [place], [place]. This information is not intended to replace advice given to you by your health care provider. Make sure you discuss any questions you have with your health care provider.  Hypertension Hypertension, commonly called high blood pressure, is when the force of blood pumping through your arteries is too strong. Your arteries are the blood vessels that carry blood from your heart throughout your body. A blood pressure reading consists of a higher number over a lower number, such as 110/72. The higher number (systolic) is the pressure inside your arteries when your heart pumps. The lower number (diastolic) is the pressure inside your arteries when your heart relaxes. Ideally you want your blood pressure below 120/80. Hypertension forces your heart to work harder to pump blood. Your arteries may become narrow or stiff. Having hypertension puts you at risk for heart disease, stroke, and other problems.  RISK FACTORS Some risk factors for high blood pressure are controllable. Others are not.  Risk factors you  cannot control include:   Race. You may be at higher risk if you are African American.  Age. Risk increases with age.  Gender. Men are at higher risk than women before age 30 years. After age 32, women are at higher risk than men. Risk factors you can control include:  Not getting enough exercise or physical activity.  Being overweight.  Getting too much fat, sugar, calories, or salt in your diet.  Drinking too much alcohol. SIGNS AND SYMPTOMS Hypertension does not usually  cause signs or symptoms. Extremely high blood pressure (hypertensive crisis) may cause headache, anxiety, shortness of breath, and nosebleed. DIAGNOSIS  To check if you have hypertension, your health care provider will measure your blood pressure while you are seated, with your arm held at the level of your heart. It should be measured at least twice using the same arm. Certain conditions can cause a difference in blood pressure between your right and left arms. A blood pressure reading that is higher than normal on one occasion does not mean that you need treatment. If one blood pressure reading is high, ask your health care provider about having it checked again. TREATMENT  Treating high blood pressure includes making lifestyle changes and possibly taking medicine. Living a healthy lifestyle can help lower high blood pressure. You may need to change some of your habits. Lifestyle changes may include:  Following the DASH diet. This diet is high in fruits, vegetables, and whole grains. It is low in salt, red meat, and added sugars.  Getting at least 2 hours of brisk physical activity every week.  Losing weight if necessary.  Not smoking.  Limiting alcoholic beverages.  Learning ways to reduce stress. If lifestyle changes are not enough to get your blood pressure under control, your health care provider may prescribe medicine. You may need to take more than one. Work closely with your health care provider to understand the risks and benefits. HOME CARE INSTRUCTIONS  Have your blood pressure rechecked as directed by your health care provider.   Take medicines only as directed by your health care provider. Follow the directions carefully. Blood pressure medicines must be taken as prescribed. The medicine does not work as well when you skip doses. Skipping doses also puts you at risk for problems.   Do not smoke.   Monitor your blood pressure at home as directed by your health care  provider. SEEK MEDICAL CARE IF:   You think you are having a reaction to medicines taken.  You have recurrent headaches or feel dizzy.  You have swelling in your ankles.  You have trouble with your vision. SEEK IMMEDIATE MEDICAL CARE IF:  You develop a severe headache or confusion.  You have unusual weakness, numbness, or feel faint.  You have severe chest or abdominal pain.  You vomit repeatedly.  You have trouble breathing. MAKE SURE YOU:   Understand these instructions.  Will watch your condition.  Will get help right away if you are not doing well or get worse. Document Released: 05/02/2005 Document Revised: 09/16/2013 Document Reviewed: 02/22/2013 [facility] Patient Information 2015 [place], [place]. This information is not intended to replace advice given to you by your health care provider. Make sure you discuss any questions you have with your health care provider.  DASH Eating Plan DASH stands for "Dietary Approaches to Stop Hypertension." The DASH eating plan is a healthy eating plan that has been shown to reduce high blood pressure (hypertension). Additional health benefits may include reducing  the risk of type 2 diabetes mellitus, heart disease, and stroke. The DASH eating plan may also help with weight loss. WHAT DO I NEED TO KNOW ABOUT THE DASH EATING PLAN? For the DASH eating plan, you will follow these general guidelines:  Choose foods with a percent daily value for sodium of less than 5% (as listed on the food label).  Use salt-free seasonings or herbs instead of table salt or sea salt.  Check with your health care provider or pharmacist before using salt substitutes.  Eat lower-sodium products, often labeled as "lower sodium" or "no salt added."  Eat fresh foods.  Eat more vegetables, fruits, and low-fat dairy products.  Choose whole grains. Look for the word "whole" as the first word in the ingredient list.  Choose fish and skinless chicken or [place]  more often than red meat. Limit fish, poultry, and meat to 6 oz (170 g) each day.  Limit sweets, desserts, sugars, and sugary drinks.  Choose heart-healthy fats.  Limit cheese to 1 oz (28 g) per day.  Eat more home-cooked food and less restaurant, buffet, and fast food.  Limit fried foods.  Cook foods using methods other than frying.  Limit canned vegetables. If you do use them, rinse them well to decrease the sodium.  When eating at a restaurant, ask that your food be prepared with less salt, or no salt if possible. WHAT FOODS CAN I EAT? Seek help from a dietitian for individual calorie needs. Grains Whole grain or whole wheat bread. Brown rice. Whole grain or whole wheat pasta. Quinoa, bulgur, and whole grain cereals. Low-sodium cereals. Corn or whole wheat flour tortillas. Whole grain cornbread. Whole grain crackers. Low-sodium crackers. Vegetables Fresh or frozen vegetables (raw, steamed, roasted, or grilled). Low-sodium or reduced-sodium tomato and vegetable juices. Low-sodium or reduced-sodium tomato sauce and paste. Low-sodium or reduced-sodium canned vegetables.  Fruits All fresh, canned (in natural juice), or frozen fruits. Meat and Other Protein Products Ground beef (85% or leaner), grass-fed beef, or beef trimmed of fat. Skinless chicken or [place]. Ground chicken or [place]. Pork trimmed of fat. All fish and seafood. Eggs. Dried beans, peas, or lentils. Unsalted nuts and seeds. Unsalted canned beans. Dairy Low-fat dairy products, such as skim or 1% milk, 2% or reduced-fat cheeses, low-fat ricotta or cottage cheese, or plain low-fat yogurt. Low-sodium or reduced-sodium cheeses. Fats and Oils Tub margarines without trans fats. Light or reduced-fat mayonnaise and salad dressings (reduced sodium). Avocado. Safflower, olive, or canola oils. Natural peanut or almond butter. Other Unsalted popcorn and pretzels. The items listed above may not be a complete list of recommended foods  or beverages. Contact your dietitian for more options. WHAT FOODS ARE NOT RECOMMENDED? Grains White bread. White pasta. White rice. Refined cornbread. Bagels and croissants. Crackers that contain trans fat. Vegetables Creamed or fried vegetables. Vegetables in a cheese sauce. Regular canned vegetables. Regular canned tomato sauce and paste. Regular tomato and vegetable juices. Fruits Dried fruits. Canned fruit in light or heavy syrup. Fruit juice. Meat and Other Protein Products Fatty cuts of meat. Ribs, chicken wings, bacon, sausage, bologna, salami, chitterlings, fatback, hot dogs, bratwurst, and packaged luncheon meats. Salted nuts and seeds. Canned beans with salt. Dairy Whole or 2% milk, cream, half-and-half, and cream cheese. Whole-fat or sweetened yogurt. Full-fat cheeses or blue cheese. Nondairy creamers and whipped toppings. Processed cheese, cheese spreads, or cheese curds. Condiments Onion and garlic salt, seasoned salt, table salt, and sea salt. Canned and packaged gravies. Worcestershire sauce. Tartar  sauce. Barbecue sauce. Teriyaki sauce. Soy sauce, including reduced sodium. Steak sauce. Fish sauce. Oyster sauce. Cocktail sauce. Horseradish. Ketchup and mustard. Meat flavorings and tenderizers. Bouillon cubes. Hot sauce. Tabasco sauce. Marinades. Taco seasonings. Relishes. Fats and Oils Butter, stick margarine, lard, shortening, ghee, and bacon fat. Coconut, palm kernel, or palm oils. Regular salad dressings. Other Pickles and olives. Salted popcorn and pretzels. The items listed above may not be a complete list of foods and beverages to avoid. Contact your dietitian for more information. WHERE CAN I FIND MORE INFORMATION? National Heart, Lung, and Blood Institute: [employer] Document Released: 04/21/2011 Document Revised: 09/16/2013 Document Reviewed: 03/06/2013 [facility] Patient Information 2015 [place], [place]. This information is not  intended to replace advice given to you by your health care provider. Make sure you discuss any questions you have with your health care provider.

## 2015-01-12 NOTE — ED Provider Notes (Signed)
CSN: [id number]     Arrival date & time 01/12/15  4098 History   First MD Initiated Contact with Patient 01/12/15 [phone]     Chief Complaint  Patient presents with  . Abdominal Cramping     (Consider location/radiation/quality/duration/timing/severity/associated sxs/prior Treatment) HPI Shawn Patrick Is a 44 year old male who presents the emergency department with chief complaint of diarrhea and concern about his umbilical hernia. The patient states that he is uninsured and thinks he has multiple medical problems. Patient states that 2 days ago he developed abdominal cramping and pain. He had 4 episodes of watery diarrhea Saturday and Sunday. Today, Monday, the patient maybe a solid stool. He states his stool was black. However, he was taking Pepto-Bismol all weekend to treat his symptoms. He has some mild abdominal cramping at this time. He denies nausea or vomiting. He denies fever, myalgias, or cold sweats. He has no other contacts with similar symptoms and no recent travel history. The patient also complains of intermittent pain in his umbilical hernia site. He states that sometimes when he presses against it, it hurts. He rates the pain as dull and achy at a 5 out of 10. He denies any radiation. He denies any acute onset severe pain. Patient felt like he might be contributing to his diarrhea symptoms. The patient also states that he is very somnolent and frequently falls asleep. He states that he snores heavily. He is not sure if he has obstructive sleep apnea. He is a current daily smoker. He also complains of toenail fungus and feels that it is because he has "internal problems with my organs and blood."  Past Medical History  Diagnosis Date  . Hypertension    No past surgical history on file. No family history on file. Social History  Substance Use Topics  . Smoking status: Current Every Day Smoker -- 1.00 packs/day    Types: Cigarettes  . Smokeless tobacco: Not on file  . Alcohol  Use: Yes    Review of Systems Ten systems reviewed and are negative for acute change, except as noted in the HPI.     Allergies  Review of patient's allergies indicates no known allergies.  Home Medications   Prior to Admission medications   Medication Sig Start Date End Date Taking? Authorizing Provider  HYDROcodone-acetaminophen (NORCO/VICODIN) 5-325 MG per tablet Take 1 tablet by mouth every 6 (six) hours as needed for moderate pain. 03/17/14   Elwin Mocha, MD  ibuprofen (ADVIL,MOTRIN) 200 MG tablet Take 400 mg by mouth every 6 (six) hours as needed (pain).    Historical Provider, MD  lisinopril (PRINIVIL) 10 MG tablet Take 1 tablet (10 mg total) by mouth daily. Patient not taking: Reported on 03/17/2014 09/28/13   Gilda Crease, MD   BP 144/90 mmHg  Pulse 93  Temp(Src) 97.7 F (36.5 C) (Oral)  Resp 20  SpO2 93% Physical Exam  Constitutional: He is oriented to person, place, and time. He appears well-developed and well-nourished. No distress.  Morbidly obese male in no acute distress. Patient makes snoring/snorting sounds when talking  HENT:  Head: Normocephalic and atraumatic.  Eyes: Conjunctivae are normal. No scleral icterus.  Neck: Normal range of motion. Neck supple.  Cardiovascular: Normal rate, regular rhythm and normal heart sounds.   Pulmonary/Chest: Effort normal and breath sounds normal. No respiratory distress. He has no wheezes. He has no rales.  Abdominal: Soft. Bowel sounds are normal. There is tenderness.  Morbidly obese abdomen. He has a central umbilical hernia  which is easily reducible. It is mildly tender to palpation. No other abdominal tenderness. Bowel sounds are normal.  Musculoskeletal: He exhibits no edema.  Neurological: He is alert and oriented to person, place, and time.  Skin: Skin is warm and dry. He is not diaphoretic.  Thickened yellow nails along both feet  Psychiatric: His behavior is normal.  Nursing note and vitals  reviewed.   ED Course  Procedures (including critical care time) Labs Review Labs Reviewed - No data to display  Imaging Review No results found. I have personally reviewed and evaluated these images and lab results as part of my medical decision-making.   EKG Interpretation None      MDM   Final diagnoses:  Umbilical hernia without obstruction and without gangrene  Essential hypertension  Hyperglycemia    10:17 AM BP 144/90 mmHg  Pulse 93  Temp(Src) 97.7 F (36.5 C) (Oral)  Resp 20  SpO2 93% Patient here with complaint of abdominal pain, recent likely viral diarrheal illness which seems to be referred resolving. I would suspect the patient has a diagnosis of obstructive sleep apnea and obesity hypoventilation syndrome considering his very short neck, and snorting and snoring sounds with speech and breathing. His umbilical hernia is easily reducible. Patient will receive basic labs today to make sure his electrolyte values are normal.   11:20 AM Patient labs show a leukocytosis. He is afebrile. He has a benign abdominal exam on repeat examination. Continues to have some tenesmus. Patient will get Bentyl at discharge. Patient is also daily smoker and I spent approximately 5 minutes in counseling on smoking cessation. I also spent a partially 5 minutes discussing lifestyle modifications which may help with the patient regarding weight loss. The patient will be [facility] for further management physician concerning his hyperglycemia. He appears safe for discharge at this time. Referral to [facility] surgery for elective procedure for hernia repair.  Arthor Captain, PA-C 01/12/15 1122  Marily Memos, MD 01/12/15 [phone]

## 2015-01-12 NOTE — ED Notes (Signed)
Patient to ED with C/O abdominal cramping.  States that he has had some diarrhea.  Also C/O having an abdominal hernia.

## 2015-09-21 IMAGING — CR DG CHEST 2V
2 series · 2 of 2 positions shown · non-contrast
Comparison: PA and lateral chest of September 28, 2013

CLINICAL DATA: Three day history of left-sided chest pain,
shortness of breath, and cough

EXAM:
CHEST  2 VIEW

[w chest pa]
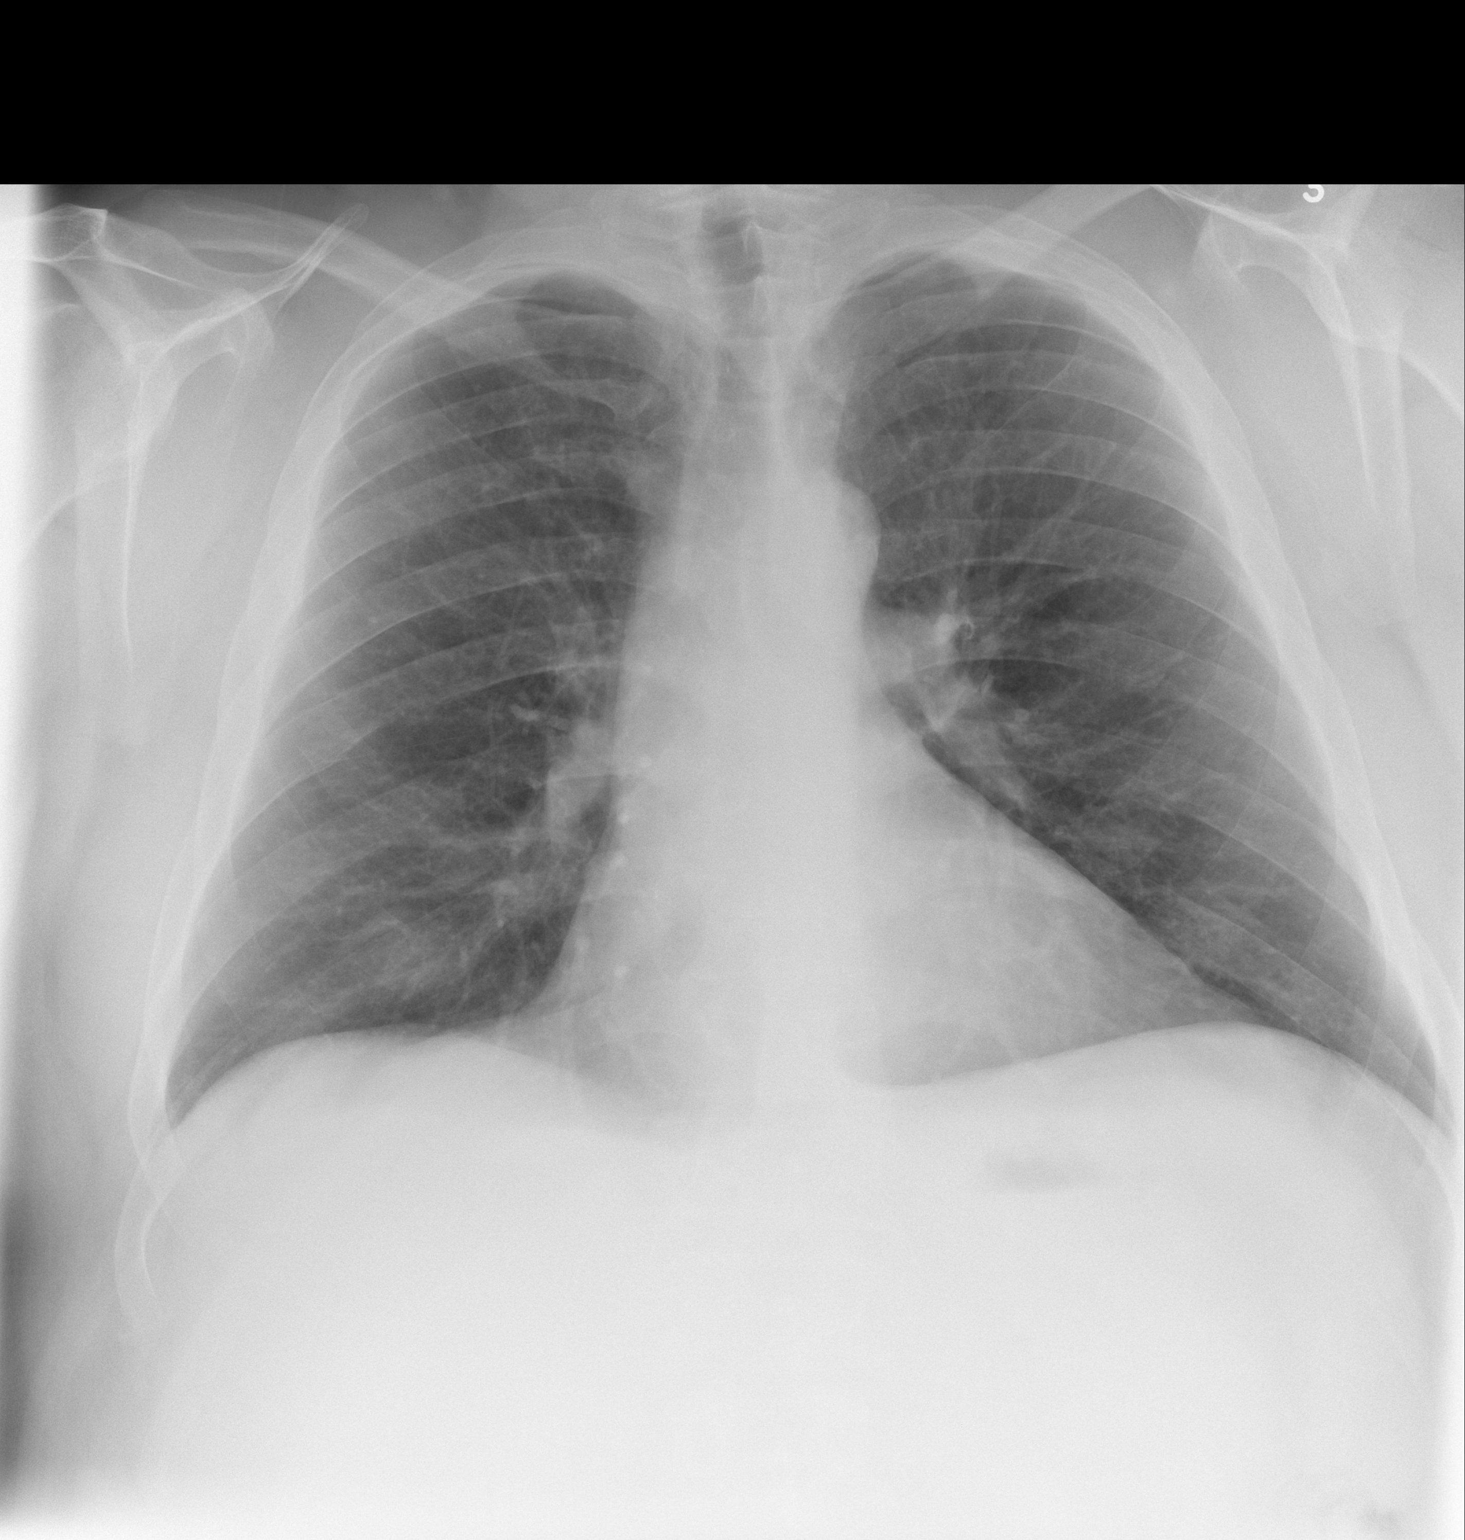

[w chest lat]
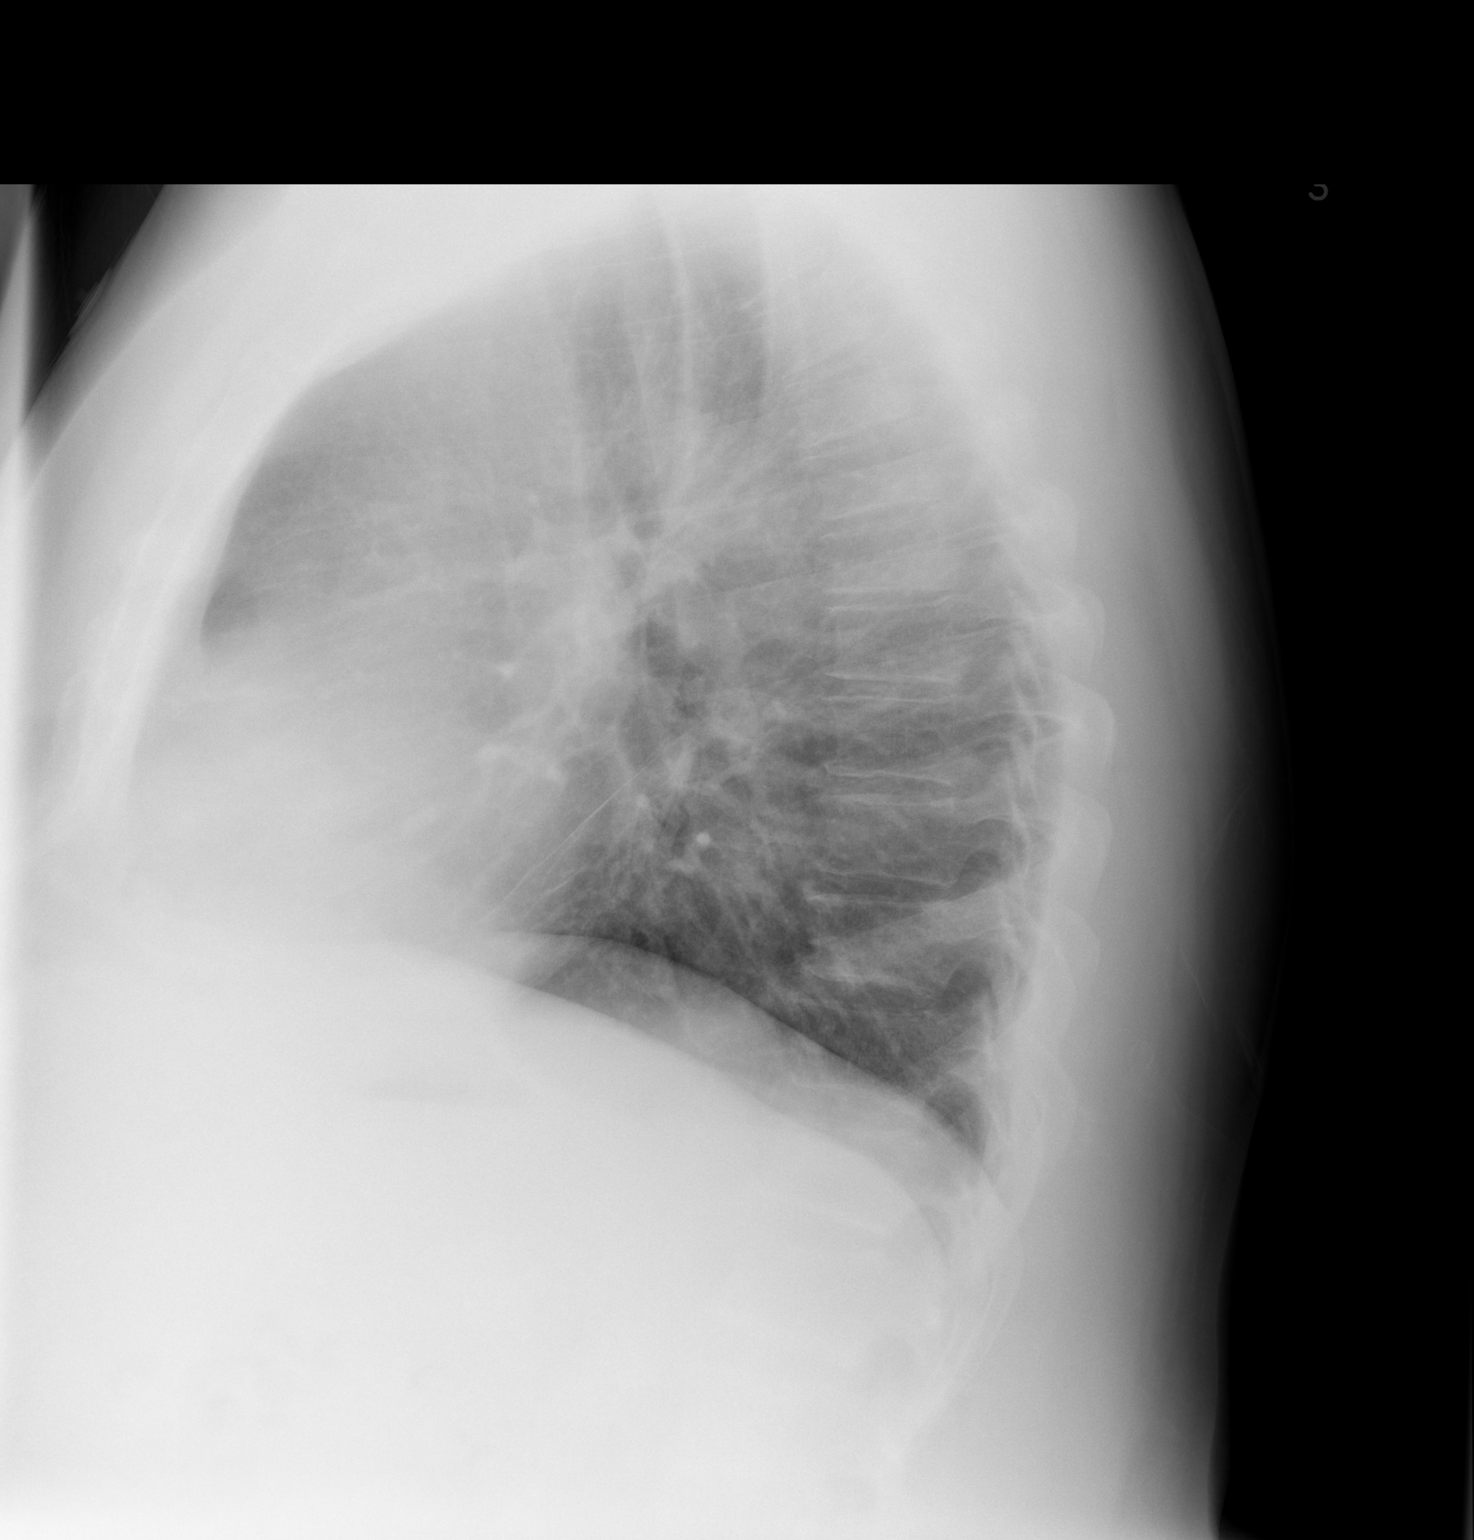

[2 of 2 positions shown; findings below may reference images not displayed]

FINDINGS: The lungs are adequately inflated. The interstitial markings are
coarse but stable. There is no alveolar infiltrate. The heart and
pulmonary vascularity are normal. There is no pleural effusion. The
trachea is midline. The bony thorax is unremarkable.
IMPRESSION: There is no evidence of pneumonia, CHF, nor other acute
cardiopulmonary abnormality. Coarse interstitial lung markings
likely reflect the patient's smoking history.

## 2016-01-27 ENCOUNTER — Emergency Department (HOSPITAL_COMMUNITY): Payer: Self-pay

## 2016-01-27 ENCOUNTER — Emergency Department (HOSPITAL_COMMUNITY)
Admission: EM | Admit: 2016-01-27 | Discharge: 2016-01-27 | Disposition: A | Discharge status: Home / Self Care | Payer: Self-pay | Attending: Emergency Medicine | Admitting: Emergency Medicine

## 2016-01-27 ENCOUNTER — Encounter (HOSPITAL_COMMUNITY): Payer: Self-pay

## 2016-01-27 DIAGNOSIS — I10 Essential (primary) hypertension: Secondary | ICD-10-CM | POA: Insufficient documentation

## 2016-01-27 DIAGNOSIS — K5900 Constipation, unspecified: Secondary | ICD-10-CM | POA: Insufficient documentation

## 2016-01-27 DIAGNOSIS — F17210 Nicotine dependence, cigarettes, uncomplicated: Secondary | ICD-10-CM | POA: Insufficient documentation

## 2016-01-27 DIAGNOSIS — R109 Unspecified abdominal pain: Secondary | ICD-10-CM

## 2016-01-27 DIAGNOSIS — Z79899 Other long term (current) drug therapy: Secondary | ICD-10-CM | POA: Insufficient documentation

## 2016-01-27 LAB — COMPREHENSIVE METABOLIC PANEL
ALT: 22 U/L (ref 17–63)
AST: 26 U/L (ref 15–41)
Albumin: 3.4 g/dL — ABNORMAL LOW (ref 3.5–5.0)
Alkaline Phosphatase: 84 U/L (ref 38–126)
Anion gap: 7 (ref 5–15)
BUN: 7 mg/dL (ref 6–20)
CO2: 30 mmol/L (ref 22–32)
Calcium: 9.2 mg/dL (ref 8.9–10.3)
Chloride: 93 mmol/L — ABNORMAL LOW (ref 101–111)
Creatinine, Ser: 0.82 mg/dL (ref 0.61–1.24)
GFR calc Af Amer: >60 mL/min (ref >60)
GFR calc non Af Amer: >60 mL/min (ref >60)
Glucose, Bld: 420 mg/dL — ABNORMAL HIGH (ref 65–99)
Potassium: 4.4 mmol/L (ref 3.5–5.1)
Sodium: 130 mmol/L — ABNORMAL LOW (ref 135–145)
Total Bilirubin: 0.8 mg/dL (ref 0.3–1.2)
Total Protein: 6.5 g/dL (ref 6.5–8.1)

## 2016-01-27 LAB — URINE MICROSCOPIC-ADD ON

## 2016-01-27 LAB — URINALYSIS, ROUTINE W REFLEX MICROSCOPIC
Bilirubin Urine: NEGATIVE
Glucose, UA: >1000 mg/dL — AB
Hgb urine dipstick: NEGATIVE
Ketones, ur: 15 mg/dL — AB
Leukocytes, UA: NEGATIVE
Nitrite: NEGATIVE
Protein, ur: NEGATIVE mg/dL
Specific Gravity, Urine: 1.046 — ABNORMAL HIGH (ref 1.005–1.030)
pH: 5.5 (ref 5.0–8.0)

## 2016-01-27 LAB — LIPASE, BLOOD: Lipase: 26 U/L (ref 11–51)

## 2016-01-27 LAB — CBC
HCT: 52.2 % — ABNORMAL HIGH (ref 39.0–52.0)
Hemoglobin: 17.7 g/dL — ABNORMAL HIGH (ref 13.0–17.0)
MCH: 29.5 pg (ref 26.0–34.0)
MCHC: 33.9 g/dL (ref 30.0–36.0)
MCV: 86.9 fL (ref 78.0–100.0)
Platelets: 312 10*3/uL (ref 150–400)
RBC: 6.01 MIL/uL — ABNORMAL HIGH (ref 4.22–5.81)
RDW: 13.8 % (ref 11.5–15.5)
WBC: 12.5 10*3/uL — ABNORMAL HIGH (ref 4.0–10.5)

## 2016-01-27 MED ORDER — IOPAMIDOL (ISOVUE-300) INJECTION 61%
INTRAVENOUS | Status: AC
  Administered 2016-01-27: 100 mL
  Filled 2016-01-27: qty 100

## 2016-01-27 MED ORDER — SODIUM CHLORIDE 0.9 % IV BOLUS (SEPSIS)
1000.0000 mL | Freq: Once | INTRAVENOUS | Status: AC
  Administered 2016-01-27: 1000 mL via INTRAVENOUS

## 2016-01-27 MED ORDER — LACTULOSE 10 GM/15ML PO SOLN: 10.0000 g | Freq: Two times a day (BID) | ORAL | 0 refills | Status: DC

## 2016-01-27 NOTE — Discharge Instructions (Signed)
Return here as needed.  Follow-up with an urgent care.  Primary care doctor or the clinic provided

## 2016-01-27 NOTE — ED Provider Notes (Signed)
[facility]PT Provider Note   CSN: [id number] Arrival date & time: 01/27/16  1451     History   Chief Complaint Chief Complaint  Patient presents with  . Abdominal Pain  . Constipation    HPI Shawn Patrick is a 45 y.o. male.  HPI Patient presents to the emergency department with abdominal pain with constipation over the last 3 days.  Patient states that he has had some increasing abdominal discomfort and constipation.  He states is been taking MiraLAX without relief of his symptoms.  Patient states nothing is make the condition better or worse.  He states this time.  He is not having any current abdominal discomfort.  Does have an umbilical hernia that has been bothering him more recently. The patient denies chest pain, shortness of breath, headache,blurred vision, neck pain, fever, cough, weakness, numbness, dizziness, anorexia, edema,  nausea, vomiting, diarrhea, rash, back pain, dysuria, hematemesis, bloody stool, near syncope, or syncope. Past Medical History:  Diagnosis Date  . Hypertension     There are no active problems to display for this patient.   History reviewed. No pertinent surgical history.     Home Medications    Prior to Admission medications   Medication Sig Start Date End Date Taking? Authorizing Provider  ibuprofen (ADVIL,MOTRIN) 200 MG tablet Take 400 mg by mouth every 6 (six) hours as needed for moderate pain.   Yes Historical Provider, MD  dicyclomine (BENTYL) 20 MG tablet Take 1 tablet (20 mg total) by mouth 2 (two) times daily. Patient not taking: Reported on 01/27/2016 01/12/15   Arthor CaptainAbigail Harris, PA-C  HYDROcodone-acetaminophen (NORCO/VICODIN) 5-325 MG per tablet Take 1 tablet by mouth every 6 (six) hours as needed for moderate pain. Patient not taking: Reported on 01/27/2016 03/17/14   Elwin MochaBlair Walden, MD  lisinopril (PRINIVIL) 10 MG tablet Take 1 tablet (10 mg total) by mouth daily. Patient not taking: Reported on 01/27/2016 09/28/13   Gilda Creasehristopher  J Pollina, MD    Family History No family history on file.  Social History Social History  Substance Use Topics  . Smoking status: Current Every Day Smoker    Packs/day: 1.00    Types: Cigarettes  . Smokeless tobacco: Never Used  . Alcohol use Yes     Allergies   Review of patient's allergies indicates no known allergies.   Review of Systems Review of Systems  All other systems negative except as documented in the HPI. All pertinent positives and negatives as reviewed in the HPI. Physical Exam Updated Vital Signs BP (!) 132/110 (BP Location: Right Arm)   Pulse 77   Temp 98.6 F (37 C) (Oral)   Resp 14   Ht 5\' 9"  (1.753 m)   Wt (!) 136.5 kg   SpO2 95%   BMI 44.45 kg/m   Physical Exam  Constitutional: He is oriented to person, place, and time. He appears well-developed and well-nourished. No distress.  HENT:  Head: Normocephalic and atraumatic.  Mouth/Throat: Oropharynx is clear and moist.  Eyes: Pupils are equal, round, and reactive to light.  Neck: Normal range of motion. Neck supple.  Cardiovascular: Normal rate, regular rhythm and normal heart sounds.  Exam reveals no gallop and no friction rub.   No murmur heard. Pulmonary/Chest: Effort normal and breath sounds normal. No respiratory distress.  Abdominal: Soft. Bowel sounds are normal. He exhibits no distension and no mass. There is no tenderness. There is no guarding. A hernia is present.  Musculoskeletal: He exhibits no edema.  Neurological:  He is alert and oriented to person, place, and time. He exhibits normal muscle tone. Coordination normal.  Skin: Skin is warm and dry. No rash noted. No erythema.  Psychiatric: He has a normal mood and affect. His behavior is normal.  Nursing note and vitals reviewed.    ED Treatments / Results  Labs (all labs ordered are listed, but only abnormal results are displayed) Labs Reviewed  COMPREHENSIVE METABOLIC PANEL - Abnormal; Notable for the following:        Result Value   Sodium 130 (*)    Chloride 93 (*)    Glucose, Bld 420 (*)    Albumin 3.4 (*)    All other components within normal limits  CBC - Abnormal; Notable for the following:    WBC 12.5 (*)    RBC 6.01 (*)    Hemoglobin 17.7 (*)    HCT 52.2 (*)    All other components within normal limits  URINALYSIS, ROUTINE W REFLEX MICROSCOPIC (NOT AT [facility]) - Abnormal; Notable for the following:    Specific Gravity, Urine 1.046 (*)    Glucose, UA >1000 (*)    Ketones, ur 15 (*)    All other components within normal limits  URINE MICROSCOPIC-ADD ON - Abnormal; Notable for the following:    Squamous Epithelial / LPF 0-5 (*)    Bacteria, UA RARE (*)    All other components within normal limits  LIPASE, BLOOD    EKG  EKG Interpretation None       Radiology Ct Abdomen Pelvis W Contrast  Result Date: 01/27/2016 CLINICAL DATA:  Lower abdominal pain and constipation. EXAM: CT ABDOMEN AND PELVIS WITH CONTRAST TECHNIQUE: Multidetector CT imaging of the abdomen and pelvis was performed using the standard protocol following bolus administration of intravenous contrast. CONTRAST:  ISOVUE-300 IOPAMIDOL (ISOVUE-300) INJECTION 61% COMPARISON:  CT chest 05/19/2009. FINDINGS: Lower chest: Mild linear scarring at both lung bases. No pleural or pericardial fluid. Hepatobiliary: Diffuse fatty change of the liver. No focal lesion visible. No calcified gallstones. Pancreas: Normal Spleen: Normal Adrenals/Urinary Tract: Chronic left adrenal low-density adenoma unchanged from previous chest CT. Maximal dimension 17 mm. Both kidneys normal without cyst, mass, stone or hydronephrosis. No passing stone. No stone in the bladder. Stomach/Bowel: Periumbilical hernia containing only fat. No bowel pathology. Normal appearing appendix. Normal amount of stool. Vascular/Lymphatic: Early aortic atherosclerosis. IVC is normal. No retroperitoneal mass or adenopathy. Reproductive: Normal Other: None Musculoskeletal: Mild  lumbar degenerative changes. IMPRESSION: Diffuse fatty change of the liver. Periumbilical hernia containing only fat. Left adrenal adenoma, unchanged since 2011. Aortic atherosclerosis.  Early. Electronically Signed   By: Paulina Fusi M.D.   On: 01/27/2016 21:30    Procedures Procedures (including critical care time)  Medications Ordered in ED Medications  sodium chloride 0.9 % bolus 1,000 mL (1,000 mLs Intravenous New Bag/Given 01/27/16 1958)  iopamidol (ISOVUE-300) 61 % injection (100 mLs  Contrast Given 01/27/16 2108)     Initial Impression / Assessment and Plan / ED Course  I have reviewed the triage vital signs and the nursing notes.  Pertinent labs & imaging results that were available during my care of the patient were reviewed by me and considered in my medical decision making (see chart for details).  Clinical Course    Patient be treated for constipation.  Told to follow up with a primary care doctor, urgent care.  Patient is given resources.  Told to increase his fluid intake and rest as much possible.  He  was given IV fluids here in the emergency department as well.  Patient, CT scan did not show any acute findings  Final Clinical Impressions(s) / ED Diagnoses   Final diagnoses:  None    New Prescriptions New Prescriptions   No medications on file     Charlestine Night, PA-C 01/28/16 0142    Raeford Razor, MD 01/28/16 1408

## 2016-01-27 NOTE — ED Triage Notes (Signed)
Patient complains of abdominal bloating and constipation x 2 days, taking miralax with just a little relief this am with small bowel movement. No vomiting, no abdominal pain. Concerned that his abdominal hernia is acting up. NAD.

## 2016-03-16 ENCOUNTER — Emergency Department (HOSPITAL_COMMUNITY): Payer: Worker's Compensation

## 2016-03-16 ENCOUNTER — Emergency Department (HOSPITAL_COMMUNITY)
Admission: EM | Admit: 2016-03-16 | Discharge: 2016-03-16 | Disposition: A | Discharge status: Home / Self Care | Payer: Worker's Compensation | Attending: Emergency Medicine | Admitting: Emergency Medicine

## 2016-03-16 ENCOUNTER — Encounter (HOSPITAL_COMMUNITY): Payer: Self-pay | Admitting: *Deleted

## 2016-03-16 DIAGNOSIS — R0781 Pleurodynia: Secondary | ICD-10-CM | POA: Diagnosis present

## 2016-03-16 DIAGNOSIS — W010XXA Fall on same level from slipping, tripping and stumbling without subsequent striking against object, initial encounter: Secondary | ICD-10-CM | POA: Diagnosis not present

## 2016-03-16 DIAGNOSIS — Y990 Civilian activity done for income or pay: Secondary | ICD-10-CM | POA: Diagnosis not present

## 2016-03-16 DIAGNOSIS — Y9389 Activity, other specified: Secondary | ICD-10-CM | POA: Insufficient documentation

## 2016-03-16 DIAGNOSIS — F17210 Nicotine dependence, cigarettes, uncomplicated: Secondary | ICD-10-CM | POA: Insufficient documentation

## 2016-03-16 DIAGNOSIS — I10 Essential (primary) hypertension: Secondary | ICD-10-CM | POA: Diagnosis not present

## 2016-03-16 DIAGNOSIS — Y9289 Other specified places as the place of occurrence of the external cause: Secondary | ICD-10-CM | POA: Diagnosis not present

## 2016-03-16 HISTORY — DX: Obesity, unspecified: E66.9

## 2016-03-16 NOTE — ED Notes (Signed)
Patient transported to X-ray 

## 2016-03-16 NOTE — ED Provider Notes (Signed)
[facility] Provider Note   CSN: [id number] Arrival date & time: 03/16/16  1112  By signing my name below, I, Shawn Patrick, attest that this documentation has been prepared under the direction and in the presence of non-physician practitioner, Teressa LowerVrinda Sofhia Ulibarri, NP. Electronically Signed: Majel HomerPeyton Patrick, Scribe. 03/16/2016. 11:31 AM.  History   Chief Complaint Chief Complaint  Patient presents with  . Fall   The history is provided by the patient. No language interpreter was used.    HPI Comments: Shawn Patrick is a 45 y.o. male who presents to the Emergency Department complaining of gradually worsening, left rib pain s/p a fall that occurred at ~4:00 PM yesterday. Pt reports he was working in [employer] yesterday when he suddenly tripped and fell onto a pile of bricks on his left side. He denies hitting his head or loss of consciousness. Pt is not currently on blood thinners.   Past Medical History:  Diagnosis Date  . Hypertension   . Obesity    There are no active problems to display for this patient.  History reviewed. No pertinent surgical history.  Home Medications    Prior to Admission medications   Medication Sig Start Date End Date Taking? Authorizing Provider  dicyclomine (BENTYL) 20 MG tablet Take 1 tablet (20 mg total) by mouth 2 (two) times daily. Patient not taking: Reported on 01/27/2016 01/12/15   Arthor CaptainAbigail Harris, PA-C  HYDROcodone-acetaminophen (NORCO/VICODIN) 5-325 MG per tablet Take 1 tablet by mouth every 6 (six) hours as needed for moderate pain. Patient not taking: Reported on 01/27/2016 03/17/14   Elwin MochaBlair Walden, MD  ibuprofen (ADVIL,MOTRIN) 200 MG tablet Take 400 mg by mouth every 6 (six) hours as needed for moderate pain.    Historical Provider, MD  lactulose (CHRONULAC) 10 GM/15ML solution Take 15 mLs (10 g total) by mouth 2 (two) times daily. 01/27/16   Christopher Lawyer, PA-C  lisinopril (PRINIVIL) 10 MG tablet Take 1 tablet (10 mg total) by mouth  daily. Patient not taking: Reported on 01/27/2016 09/28/13   Gilda Creasehristopher J Pollina, MD    Family History History reviewed. No pertinent family history.  Social History Social History  Substance Use Topics  . Smoking status: Current Every Day Smoker    Packs/day: 1.00    Types: Cigarettes  . Smokeless tobacco: Never Used  . Alcohol use Yes     Allergies   Review of patient's allergies indicates no known allergies.   Review of Systems Review of Systems  Musculoskeletal: Positive for arthralgias.  Neurological: Negative for syncope.  All other systems reviewed and are negative.  Physical Exam Updated Vital Signs BP 172/85 (BP Location: Left Arm)   Pulse 86   Temp 98.1 F (36.7 C) (Oral)   Resp 16   SpO2 97%   Physical Exam  Constitutional: He is oriented to person, place, and time. He appears well-developed and well-nourished.  HENT:  Head: Normocephalic.  Eyes: EOM are normal.  Neck: Normal range of motion.  Pulmonary/Chest: Effort normal.  Tender to the left lateral ribs.  Abdominal: He exhibits no distension.  Musculoskeletal: Normal range of motion.  Neurological: He is alert and oriented to person, place, and time.  Psychiatric: He has a normal mood and affect.  Nursing note and vitals reviewed.  ED Treatments / Results  Labs (all labs ordered are listed, but only abnormal results are displayed) Labs Reviewed - No data to display  EKG  EKG Interpretation None       Radiology No results  found.  Procedures Procedures (including critical care time)  Medications Ordered in ED Medications - No data to display  DIAGNOSTIC STUDIES:  Oxygen Saturation is 97% on RA, normal by my interpretation.    COORDINATION OF CARE:  11:29 AM Discussed treatment plan with pt at bedside and pt agreed to plan.  Initial Impression / Assessment and Plan / ED Course  I have reviewed the triage vital signs and the nursing notes.  Pertinent labs & imaging results  that were available during my care of the patient were reviewed by me and considered in my medical decision making (see chart for details).  Clinical Course    No acute bony injury. Pt can take antiinflammatory  I personally performed the services described in this documentation, which was scribed in my presence. The recorded information has been reviewed and is accurate.   Final Clinical Impressions(s) / ED Diagnoses   Final diagnoses:  None    New Prescriptions New Prescriptions   No medications on file     Teressa LowerVrinda Amiere Cawley, NP 03/16/16 1236    Arby BarretteMarcy Pfeiffer, MD 03/28/16 1541

## 2016-03-16 NOTE — ED Notes (Signed)
Pt is in stable condition upon dc and ambulates from ED. [id number] coupon given.

## 2016-03-16 NOTE — ED Triage Notes (Signed)
Pt reports tripping and falling yesterday while at work. Pt landed on equipment to left side, now has left rib pain. Denies loc, is not on blood thinners. No acute distress noted at triage.

## 2016-03-21 ENCOUNTER — Emergency Department (HOSPITAL_COMMUNITY)
Admission: EM | Admit: 2016-03-21 | Discharge: 2016-03-21 | Disposition: A | Discharge status: Home / Self Care | Payer: Worker's Compensation | Attending: Emergency Medicine | Admitting: Emergency Medicine

## 2016-03-21 ENCOUNTER — Emergency Department (HOSPITAL_COMMUNITY): Payer: Worker's Compensation

## 2016-03-21 ENCOUNTER — Encounter (HOSPITAL_COMMUNITY): Payer: Self-pay | Admitting: Emergency Medicine

## 2016-03-21 DIAGNOSIS — S299XXA Unspecified injury of thorax, initial encounter: Secondary | ICD-10-CM | POA: Diagnosis present

## 2016-03-21 DIAGNOSIS — W01198A Fall on same level from slipping, tripping and stumbling with subsequent striking against other object, initial encounter: Secondary | ICD-10-CM | POA: Diagnosis not present

## 2016-03-21 DIAGNOSIS — Y990 Civilian activity done for income or pay: Secondary | ICD-10-CM | POA: Diagnosis not present

## 2016-03-21 DIAGNOSIS — Y929 Unspecified place or not applicable: Secondary | ICD-10-CM | POA: Diagnosis not present

## 2016-03-21 DIAGNOSIS — Y939 Activity, unspecified: Secondary | ICD-10-CM | POA: Diagnosis not present

## 2016-03-21 DIAGNOSIS — W19XXXA Unspecified fall, initial encounter: Secondary | ICD-10-CM

## 2016-03-21 DIAGNOSIS — S2242XA Multiple fractures of ribs, left side, initial encounter for closed fracture: Secondary | ICD-10-CM | POA: Diagnosis not present

## 2016-03-21 DIAGNOSIS — F17210 Nicotine dependence, cigarettes, uncomplicated: Secondary | ICD-10-CM | POA: Diagnosis not present

## 2016-03-21 DIAGNOSIS — I10 Essential (primary) hypertension: Secondary | ICD-10-CM | POA: Insufficient documentation

## 2016-03-21 MED ORDER — HYDROCODONE-ACETAMINOPHEN 5-325 MG PO TABS: 1.0000 | ORAL_TABLET | Freq: Four times a day (QID) | ORAL | 0 refills | Status: DC | PRN

## 2016-03-21 MED ORDER — HYDROCODONE-ACETAMINOPHEN 5-325 MG PO TABS
2.0000 | ORAL_TABLET | Freq: Once | ORAL | Status: AC
  Administered 2016-03-21: 2 via ORAL
  Filled 2016-03-21: qty 2

## 2016-03-21 MED ORDER — IOPAMIDOL (ISOVUE-300) INJECTION 61%
75.0000 mL | Freq: Once | INTRAVENOUS | Status: AC | PRN
  Administered 2016-03-21: 75 mL via INTRAVENOUS

## 2016-03-21 NOTE — ED Provider Notes (Signed)
[facility] Provider Note   CSN: [id number] Arrival date & time: 03/21/16  0740     History   Chief Complaint Chief Complaint  Patient presents with  . Fall  . Chest Pain    HPI Shawn Patrick is a 45 y.o. male.  Patient presents one week after he sustained a fall while at work where he tripped and landed on a large piece of lumber, falling onto his left chest wall. Was seen here at the time the following day and had a CXR which revealed no signs of rib fractures or other injuries. He comes back today because he has had persistent pain with coughing and deep breathing, and has felt a "popping" sensation. Denies fevers, denies dyspnea. Denies any other injuries or pain.   The history is provided by the patient. No language interpreter was used.  Fall  This is a new problem. The current episode started more than 1 week ago. The problem occurs constantly. The problem has not changed since onset.Associated symptoms include chest pain. Pertinent negatives include no shortness of breath. Exacerbated by: Deep breathing, coughing. The symptoms are relieved by rest. He has tried acetaminophen for the symptoms. The treatment provided mild relief.  Chest Pain   Pertinent negatives include no cough and no shortness of breath.    Past Medical History:  Diagnosis Date  . Hypertension   . Obesity     There are no active problems to display for this patient.   History reviewed. No pertinent surgical history.     Home Medications    Prior to Admission medications   Medication Sig Start Date End Date Taking? Authorizing Provider  ibuprofen (ADVIL,MOTRIN) 200 MG tablet Take 400 mg by mouth every 6 (six) hours as needed for moderate pain.   Yes Historical Provider, MD  HYDROcodone-acetaminophen (NORCO/VICODIN) 5-325 MG tablet Take 1-2 tablets by mouth every 6 (six) hours as needed for moderate pain. 03/21/16   Preston FleetingAnna Duwan Adrian, MD    Family History History reviewed. No pertinent  family history.  Social History Social History  Substance Use Topics  . Smoking status: Current Every Day Smoker    Packs/day: 1.00    Types: Cigarettes  . Smokeless tobacco: Never Used  . Alcohol use Yes     Allergies   Patient has no known allergies.   Review of Systems Review of Systems  HENT: Negative.   Respiratory: Negative for cough and shortness of breath.   Cardiovascular: Positive for chest pain.  Gastrointestinal: Negative.   Genitourinary: Negative.   Musculoskeletal: Negative.   Skin: Negative.   Allergic/Immunologic: Negative for immunocompromised state.  Neurological: Negative.   Hematological: Does not bruise/bleed easily.  Psychiatric/Behavioral: Negative.      Physical Exam Updated Vital Signs BP 138/92 (BP Location: Right Arm) Comment: Simultaneous filing. User may not have seen previous data.  Pulse 85 Comment: Simultaneous filing. User may not have seen previous data.  Temp 98.2 F (36.8 C) (Oral)   Resp 18 Comment: Simultaneous filing. User may not have seen previous data.  Wt 136.1 kg   SpO2 94% Comment: Simultaneous filing. User may not have seen previous data.  BMI 44.30 kg/m   Physical Exam  Constitutional: He is oriented to person, place, and time. He appears well-developed and well-nourished. No distress.  HENT:  Head: Normocephalic and atraumatic.  Eyes: Conjunctivae and EOM are normal.  Neck: Normal range of motion. Neck supple.  Cardiovascular: Normal rate, regular rhythm and normal heart sounds.  Exam  reveals no gallop and no friction rub.   No murmur heard. Pulmonary/Chest: Effort normal and breath sounds normal. No respiratory distress. He has no wheezes. He has no rales.  Tenderness to left lateral chest wall. No crepitus or deformity.  Neurological: He is alert and oriented to person, place, and time.  Skin: Skin is warm and dry. He is not diaphoretic.  Psychiatric: He has a normal mood and affect. His behavior is normal.  Judgment and thought content normal.     ED Treatments / Results  Labs (all labs ordered are listed, but only abnormal results are displayed) Labs Reviewed - No data to display  EKG  EKG Interpretation  Date/Time:  Monday March 21 2016 07:56:17 EST Ventricular Rate:  87 PR Interval:    QRS Duration: 100 QT Interval:  344 QTC Calculation: 414 R Axis:   81 Text Interpretation:  Sinus rhythm Low voltage, precordial leads RSR' in V1 or V2, probably normal variant ST-t wave abnormality Abnormal ekg Confirmed by Gerhard Munch  MD [phone]) on 03/21/2016 8:10:42 AM       Radiology Ct Chest W Contrast  Result Date: 03/21/2016 CLINICAL DATA:  Pt fell this past Monday and landed across a landscape timber striking his left rib area, now he fells a pain and stabbing when he breaths EXAM: CT CHEST WITH CONTRAST TECHNIQUE: Multidetector CT imaging of the chest was performed during intravenous contrast administration. CONTRAST:  75mL ISOVUE-300 IOPAMIDOL (ISOVUE-300) INJECTION 61% COMPARISON:  03/17/2014 FINDINGS: Cardiovascular: No significant vascular findings. Normal heart size. No pericardial effusion. Mediastinum/Nodes: No enlarged mediastinal, hilar, or axillary lymph nodes. Thyroid gland, trachea, and esophagus demonstrate no significant findings. Lungs/Pleura: Minimal dependent atelectasis posteriorly in both lower lobes. No focal infiltrate, edema, or nodule. No pneumothorax or effusion. Upper Abdomen: Fatty liver. 18 mm left adrenal adenoma, stable. No acute findings. Musculoskeletal: Nondisplaced fractures, lateral aspect left sixth and seventh ribs. Old healed left eighth rib fracture. Anterior vertebral endplate spurring at multiple levels in the mid and lower thoracic spine. IMPRESSION: 1. Nondisplaced fractures, left sixth and seventh ribs, without pneumothorax or effusion. Electronically Signed   By: Corlis Leak M.D.   On: 03/21/2016 10:26    Procedures Procedures (including critical  care time)  Medications Ordered in ED Medications  HYDROcodone-acetaminophen (NORCO/VICODIN) 5-325 MG per tablet 2 tablet (2 tablets Oral Given 03/21/16 0849)  iopamidol (ISOVUE-300) 61 % injection 75 mL (75 mLs Intravenous Contrast Given 03/21/16 0958)     Initial Impression / Assessment and Plan / ED Course  I have reviewed the triage vital signs and the nursing notes.  Pertinent labs & imaging results that were available during my care of the patient were reviewed by me and considered in my medical decision making (see chart for details).  Clinical Course     Patient presents with persistent left-sided rib pain following a fall one week ago in which he fell onto a large piece of lumber while at work. Reports worsening pain since then and the sensation of "popping" in his ribs. EKG is similar to prior and given the pain related to his history of trauma I do not suspect that this is cardiac in nature. No obvious deformities on examination. He does have a mildly low oxygen saturation on room air with no history of lung disease. He is breathing comfortably. CT chest obtained given recent trauma and this reveals 2 nondisplaced left-sided rib fractures. No pneumothorax, no hemothorax, no pulmonary contusion. He was given an incentive spirometer and  a prescription for Norco for pain control. Was also advised to use NSAIDs scheduled for 1-2 weeks for pain control. He expressed understanding of in instructions and is in good condition for discharge home.  Final Clinical Impressions(s) / ED Diagnoses   Final diagnoses:  Fall  Closed fracture of multiple ribs of left side, initial encounter    New Prescriptions Discharge Medication List as of 03/21/2016 10:55 AM       Preston FleetingAnna Lyndel Sarate, MD 03/21/16 1242    Gerhard Munchobert Lockwood, MD 03/21/16 1530

## 2016-03-21 NOTE — ED Notes (Signed)
Patient transported to CT 

## 2016-03-21 NOTE — ED Triage Notes (Signed)
Pt arrives via POv from home with fall last week on large piece of lumber. Pt reports seen here and sent home with neg CXR but continues with left sided chest pain and rib pain. Pt reports pain is worse with movement, nonradiating. States sometimes feels SOB. Denies recent fever. Pt alert, oriented x4, VSS.

## 2016-05-14 ENCOUNTER — Emergency Department (HOSPITAL_COMMUNITY): Payer: Self-pay

## 2016-05-14 ENCOUNTER — Emergency Department (HOSPITAL_COMMUNITY)
Admission: EM | Admit: 2016-05-14 | Discharge: 2016-05-14 | Disposition: A | Discharge status: Home / Self Care | Payer: Self-pay | Attending: Emergency Medicine | Admitting: Emergency Medicine

## 2016-05-14 ENCOUNTER — Encounter (HOSPITAL_COMMUNITY): Payer: Self-pay | Admitting: Certified Nurse Midwife

## 2016-05-14 DIAGNOSIS — Z79899 Other long term (current) drug therapy: Secondary | ICD-10-CM | POA: Insufficient documentation

## 2016-05-14 DIAGNOSIS — M79641 Pain in right hand: Secondary | ICD-10-CM | POA: Insufficient documentation

## 2016-05-14 DIAGNOSIS — I10 Essential (primary) hypertension: Secondary | ICD-10-CM | POA: Insufficient documentation

## 2016-05-14 DIAGNOSIS — F17210 Nicotine dependence, cigarettes, uncomplicated: Secondary | ICD-10-CM | POA: Insufficient documentation

## 2016-05-14 MED ORDER — HYDROCODONE-ACETAMINOPHEN 5-325 MG PO TABS: 1.0000 | ORAL_TABLET | ORAL | 0 refills | Status: DC | PRN

## 2016-05-14 MED ORDER — NAPROXEN 500 MG PO TABS: 500.0000 mg | ORAL_TABLET | Freq: Two times a day (BID) | ORAL | 0 refills | Status: DC

## 2016-05-14 MED ORDER — NAPROXEN 250 MG PO TABS
500.0000 mg | ORAL_TABLET | Freq: Once | ORAL | Status: AC
  Administered 2016-05-14: 500 mg via ORAL
  Filled 2016-05-14: qty 2

## 2016-05-14 NOTE — ED Triage Notes (Signed)
Pt states he has right hand pain since before Christmas. Pt denies any injury but feels it may be broken.

## 2016-05-14 NOTE — Discharge Instructions (Signed)
Your xray today is normal.  [facility].  Take Naproxen twice daily for the next 7 days.  Take Norco every 4 hours for severe pain if needed.  Return to the ED for any new or worsening symptoms.

## 2016-05-14 NOTE — ED Provider Notes (Signed)
[facility] Provider Note   CSN: [id number] Arrival date & time: 05/14/16  1317  By signing my name below, I, Clovis PuAvnee Patel, attest that this documentation has been prepared under the direction and in the presence of  Cheri FowlerKayla Kaicen Desena, PA-C. Electronically Signed: Clovis PuAvnee Patel, ED Scribe. 05/14/16. 2:51 PM   History   Chief Complaint Chief Complaint  Patient presents with  . Hand Pain   The history is provided by the patient. No language interpreter was used.   HPI Comments:  Shawn Patrick is a 45 y.o. male, with a hx of HTN and arthritis, who presents to the Emergency Department complaining of sudden onset, persistent right hand pain x several days. Pt notes associated swelling. His pain is worse when he bumps his hand on objects which causes a shooting pain. He has taken tylenol (last does yesterday night), ibuprofen (2-3 pills) and voltaren gel with no relief. Pt denies any recent injuries, hx of gout, fevers, numbness, any other associated symptoms and any other modifying factors at this time.    Past Medical History:  Diagnosis Date  . Hypertension   . Obesity     There are no active problems to display for this patient.   History reviewed. No pertinent surgical history.     Home Medications    Prior to Admission medications   Medication Sig Start Date End Date Taking? Authorizing Provider  HYDROcodone-acetaminophen (NORCO/VICODIN) 5-325 MG tablet Take 1 tablet by mouth every 4 (four) hours as needed. 05/14/16   Cheri FowlerKayla Maximiano Lott, PA-C  ibuprofen (ADVIL,MOTRIN) 200 MG tablet Take 400 mg by mouth every 6 (six) hours as needed for moderate pain.    Historical Provider, MD  naproxen (NAPROSYN) 500 MG tablet Take 1 tablet (500 mg total) by mouth 2 (two) times daily. 05/14/16   Cheri FowlerKayla Darrious Youman, PA-C    Family History No family history on file.  Social History Social History  Substance Use Topics  . Smoking status: Current Every Day Smoker    Packs/day: 1.00    Types:  Cigarettes  . Smokeless tobacco: Never Used  . Alcohol use No     Allergies   Patient has no known allergies.   Review of Systems Review of Systems 10 systems reviewed and all are negative for acute change except as noted in the HPI.  Physical Exam Updated Vital Signs BP 130/82   Pulse 85   Temp 97.8 F (36.6 C)   Resp 14   Ht 5\' 9"  (1.753 m)   Wt 121.6 kg   SpO2 100%   BMI 39.58 kg/m   Physical Exam  Constitutional: He is oriented to person, place, and time. He appears well-developed and well-nourished. No distress.  HENT:  Head: Normocephalic and atraumatic.  Eyes: Conjunctivae are normal.  Cardiovascular: Normal rate.   Pulses:      Radial pulses are 2+ on the right side, and 2+ on the left side.  Brisk capillary refill   Pulmonary/Chest: Effort normal.  Abdominal: He exhibits no distension.  Musculoskeletal: He exhibits edema and tenderness.  Right hand with minimal bony tenderness. Mild swelling and erythema to right 1st and 2nd MCPs. Able to make a fist.   Neurological: He is alert and oriented to person, place, and time.  Strength and sensation intact  Skin: Skin is warm and dry. There is erythema.  Psychiatric: He has a normal mood and affect.  Nursing note and vitals reviewed.    ED Treatments / Results  DIAGNOSTIC STUDIES:  Oxygen Saturation is 95% on RA, normal by my interpretation.    COORDINATION OF CARE:  2:46 PM Discussed treatment plan with pt at bedside and pt agreed to plan.  Labs (all labs ordered are listed, but only abnormal results are displayed) Labs Reviewed - No data to display  EKG  EKG Interpretation None       Radiology Dg Hand Complete Right  Result Date: 05/14/2016 CLINICAL DATA:  Pt states he is having an "arthritis flare up" x before Christmas that is worsening. No injury. Obvious Swelling first and second fingers. EXAM: RIGHT HAND - COMPLETE 3+ VIEW COMPARISON:  None. FINDINGS: There is soft tissue swelling of  the thumb and second digit. Bone mineral density is normal. No acute fracture. No significant erosions or joint space narrowing. No significant osteophytes. Remote fracture of the fifth metacarpal. IMPRESSION: 1. Mild soft tissue swelling. 2.  No evidence for acute  abnormality. Electronically Signed   By: Norva PavlovElizabeth  Brown M.D.   On: 05/14/2016 14:41    Procedures Procedures (including critical care time)  Medications Ordered in ED Medications  naproxen (NAPROSYN) tablet 500 mg (500 mg Oral Given 05/14/16 1450)     Initial Impression / Assessment and Plan / ED Course  I have reviewed the triage vital signs and the nursing notes.  Pertinent labs & imaging results that were available during my care of the patient were reviewed by me and considered in my medical decision making (see chart for details).  Clinical Course    Patient X-Ray negative for obvious fracture or dislocation.  Likely arthritis.  No signs of overlying infection to warrant abx.  Conservative therapy recommended and discussed. Patient will be discharged home & is agreeable with above plan. Returns precautions discussed. Pt appears safe for discharge.   Final Clinical Impressions(s) / ED Diagnoses   Final diagnoses:  Hand pain, right    New Prescriptions Discharge Medication List as of 05/14/2016  4:12 PM    START taking these medications   Details  naproxen (NAPROSYN) 500 MG tablet Take 1 tablet (500 mg total) by mouth 2 (two) times daily., Starting Sat 05/14/2016, Print       I personally performed the services described in this documentation, which was scribed in my presence. The recorded information has been reviewed and is accurate.     Cheri FowlerKayla Daleon Willinger, PA-C 05/14/16 2028    Cathren LaineKevin Steinl, MD 05/15/16 [phone]

## 2017-02-25 ENCOUNTER — Ambulatory Visit (HOSPITAL_COMMUNITY)
Admission: EM | Admit: 2017-02-25 | Discharge: 2017-02-25 | Disposition: A | Discharge status: Home / Self Care | Payer: Worker's Compensation | Attending: Internal Medicine | Admitting: Internal Medicine

## 2017-02-25 ENCOUNTER — Encounter (HOSPITAL_COMMUNITY): Payer: Self-pay | Admitting: Emergency Medicine

## 2017-02-25 DIAGNOSIS — M545 Low back pain, unspecified: Secondary | ICD-10-CM

## 2017-02-25 MED ORDER — KETOROLAC TROMETHAMINE 60 MG/2ML IM SOLN
INTRAMUSCULAR | Status: AC
  Filled 2017-02-25: qty 2

## 2017-02-25 MED ORDER — KETOROLAC TROMETHAMINE 60 MG/2ML IM SOLN
60.0000 mg | Freq: Once | INTRAMUSCULAR | Status: AC
  Administered 2017-02-25: 60 mg via INTRAMUSCULAR

## 2017-02-25 MED ORDER — NAPROXEN 500 MG PO TABS: 500.0000 mg | ORAL_TABLET | Freq: Two times a day (BID) | ORAL | 0 refills | Status: DC

## 2017-02-25 MED ORDER — CYCLOBENZAPRINE HCL 10 MG PO TABS: 10.0000 mg | ORAL_TABLET | Freq: Every evening | ORAL | 0 refills | Status: AC | PRN

## 2017-02-25 NOTE — ED Triage Notes (Signed)
Patient reports back pain that started yesterday, patient was shoveling and felt lower back pain.  Patient says this is workers comp injury.  Says supervisor is aware

## 2017-02-25 NOTE — ED Provider Notes (Signed)
[facility]    CSN: [id number] Arrival date & time: 02/25/17  1206     History   Chief Complaint Chief Complaint  Patient presents with  . Back Pain    HPI RUEL DIMMICK is a 46 y.o. male. He was taking some stump grindings out of the ground, with a shovel, and had the sudden onset of midline pain in the lower lumbar/sacral area. Intermittently has some twinges of pinching discomfort in his buttocks, but does not extend into the legs. No leg weakness or clumsiness. Has not fallen. No change in bowel or bladder. No loss of sensation. States no history of previous back discomfort.    HPI  Past Medical History:  Diagnosis Date  . Hypertension   . Obesity     History reviewed. No pertinent surgical history.     Home Medications    Prior to Admission medications   Medication Sig Start Date End Date Taking? Authorizing Provider  cyclobenzaprine (FLEXERIL) 10 MG tablet Take 1 tablet (10 mg total) by mouth at bedtime as needed for muscle spasms. 02/25/17 03/12/17  Eustace Moore, MD  naproxen (NAPROSYN) 500 MG tablet Take 1 tablet (500 mg total) by mouth 2 (two) times daily. 02/25/17   Eustace Moore, MD    Family History No family history on file.  Social History Social History  Substance Use Topics  . Smoking status: Current Every Day Smoker    Packs/day: 1.00    Types: Cigarettes  . Smokeless tobacco: Never Used  . Alcohol use No     Allergies   Patient has no known allergies.   Review of Systems Review of Systems  All other systems reviewed and are negative.    Physical Exam Triage Vital Signs ED Triage Vitals  Enc Vitals Group     BP 02/25/17 1244 (!) 143/94     Pulse Rate 02/25/17 1244 67     Resp 02/25/17 1244 18     Temp 02/25/17 1244 98 F (36.7 C)     Temp Source 02/25/17 1244 Oral     SpO2 02/25/17 1244 96 %     Weight --      Height --      Pain Score 02/25/17 1243 8     Pain Loc --    Updated Vital Signs BP (!)  143/94 (BP Location: Right Arm) Comment (BP Location): large cuff  Pulse 67   Temp 98 F (36.7 C) (Oral)   Resp 18   SpO2 96%   Physical Exam  Constitutional: He is oriented to person, place, and time. No distress.  Alert, nicely groomed Strong smell of smoke in the room  HENT:  Head: Atraumatic.  Eyes:  Conjugate gaze, no eye redness/drainage  Neck: Neck supple.  Cardiovascular: Normal rate.   Pulmonary/Chest: No respiratory distress.  Abdominal: He exhibits no distension.  Musculoskeletal: Normal range of motion.  No focal bony tenderness to palpation, mild discomfort to palpation over the lower spine and sacrum. Able to rise from a chair quickly and easily, gait is steady, no foot drop.  Neurological: He is alert and oriented to person, place, and time.  Skin: Skin is warm and dry.  No cyanosis  Nursing note and vitals reviewed.    UC Treatments / Results   Procedures Procedures (including critical care time)  Medications Ordered in UC Medications  ketorolac (TORADOL) injection 60 mg (60 mg Intramuscular Given 02/25/17 1344)    Final Clinical Impressions(s) / UC  Diagnoses   Final diagnoses:  Acute midline low back pain without sciatica   No danger signs on exam today.  Anticipate gradual improvement in back pain over the next several days. Follow-up with occupational medicine to determine any next steps needed. Prescriptions for Naprosyn and Flexeril sent to the pharmacy. Ice for 5-10 minutes several times daily may provide additional relief.  New Prescriptions Discharge Medication List as of 02/25/2017  1:40 PM    START taking these medications   Details  cyclobenzaprine (FLEXERIL) 10 MG tablet Take 1 tablet (10 mg total) by mouth at bedtime as needed for muscle spasms., Starting Sat 02/25/2017, Until Sun 03/12/2017, Normal         Controlled Substance Prescriptions [place] Controlled Substance Registry consulted? No   Eustace Moore, MD 02/27/17 [phone]

## 2017-02-25 NOTE — Discharge Instructions (Addendum)
No danger signs on exam today.  Anticipate gradual improvement in back pain over the next several days. Follow-up with occupational medicine to determine any next steps needed. Prescriptions for Naprosyn and Flexeril sent to the pharmacy. Ice for 5-10 minutes several times daily may provide additional relief.

## 2017-03-03 ENCOUNTER — Ambulatory Visit (HOSPITAL_COMMUNITY)
Admission: EM | Admit: 2017-03-03 | Discharge: 2017-03-03 | Disposition: A | Discharge status: Home / Self Care | Payer: Self-pay

## 2017-03-03 ENCOUNTER — Ambulatory Visit
Admission: RE | Admit: 2017-03-03 | Discharge: 2017-03-03 | Disposition: A | Discharge status: Home / Self Care | Payer: No Typology Code available for payment source | Source: Ambulatory Visit | Attending: Family Medicine | Admitting: Family Medicine

## 2017-03-03 ENCOUNTER — Other Ambulatory Visit: Payer: Self-pay | Admitting: Family Medicine

## 2017-03-03 DIAGNOSIS — R52 Pain, unspecified: Secondary | ICD-10-CM

## 2017-05-30 ENCOUNTER — Encounter (HOSPITAL_COMMUNITY): Payer: Self-pay

## 2017-05-30 ENCOUNTER — Emergency Department (HOSPITAL_COMMUNITY): Payer: Self-pay

## 2017-05-30 ENCOUNTER — Inpatient Hospital Stay (HOSPITAL_COMMUNITY)
Admission: EM | Admit: 2017-05-30 | Discharge: 2017-06-03 | DRG: 727 | Disposition: A | Discharge status: Home / Self Care | Payer: Self-pay | Attending: Internal Medicine | Admitting: Internal Medicine

## 2017-05-30 ENCOUNTER — Other Ambulatory Visit: Payer: Self-pay

## 2017-05-30 DIAGNOSIS — Z9119 Patient's noncompliance with other medical treatment and regimen: Secondary | ICD-10-CM

## 2017-05-30 DIAGNOSIS — Z23 Encounter for immunization: Secondary | ICD-10-CM

## 2017-05-30 DIAGNOSIS — J9601 Acute respiratory failure with hypoxia: Secondary | ICD-10-CM | POA: Diagnosis present

## 2017-05-30 DIAGNOSIS — F17210 Nicotine dependence, cigarettes, uncomplicated: Secondary | ICD-10-CM | POA: Diagnosis present

## 2017-05-30 DIAGNOSIS — Z6841 Body Mass Index (BMI) 40.0 and over, adult: Secondary | ICD-10-CM

## 2017-05-30 DIAGNOSIS — E1165 Type 2 diabetes mellitus with hyperglycemia: Secondary | ICD-10-CM | POA: Diagnosis present

## 2017-05-30 DIAGNOSIS — E662 Morbid (severe) obesity with alveolar hypoventilation: Secondary | ICD-10-CM | POA: Diagnosis present

## 2017-05-30 DIAGNOSIS — Z8249 Family history of ischemic heart disease and other diseases of the circulatory system: Secondary | ICD-10-CM

## 2017-05-30 DIAGNOSIS — R0902 Hypoxemia: Secondary | ICD-10-CM

## 2017-05-30 DIAGNOSIS — E871 Hypo-osmolality and hyponatremia: Secondary | ICD-10-CM | POA: Diagnosis present

## 2017-05-30 DIAGNOSIS — R739 Hyperglycemia, unspecified: Secondary | ICD-10-CM | POA: Diagnosis present

## 2017-05-30 DIAGNOSIS — I10 Essential (primary) hypertension: Secondary | ICD-10-CM | POA: Diagnosis present

## 2017-05-30 DIAGNOSIS — N492 Inflammatory disorders of scrotum: Principal | ICD-10-CM | POA: Diagnosis present

## 2017-05-30 DIAGNOSIS — K047 Periapical abscess without sinus: Secondary | ICD-10-CM | POA: Diagnosis present

## 2017-05-30 DIAGNOSIS — J9602 Acute respiratory failure with hypercapnia: Secondary | ICD-10-CM | POA: Diagnosis present

## 2017-05-30 DIAGNOSIS — L0390 Cellulitis, unspecified: Secondary | ICD-10-CM | POA: Diagnosis present

## 2017-05-30 LAB — CBC WITH DIFFERENTIAL/PLATELET
Basophils Absolute: 0.1 10*3/uL (ref 0.0–0.1)
Basophils Relative: 0 %
Eosinophils Absolute: 0.3 10*3/uL (ref 0.0–0.7)
Eosinophils Relative: 1 %
HEMATOCRIT: 49.4 % (ref 39.0–52.0)
HEMOGLOBIN: 17.2 g/dL — AB (ref 13.0–17.0)
LYMPHS ABS: 2.6 10*3/uL (ref 0.7–4.0)
LYMPHS PCT: 13 %
MCH: 30 pg (ref 26.0–34.0)
MCHC: 34.8 g/dL (ref 30.0–36.0)
MCV: 86.1 fL (ref 78.0–100.0)
MONOS PCT: 6 %
Monocytes Absolute: 1.3 10*3/uL — ABNORMAL HIGH (ref 0.1–1.0)
NEUTROS PCT: 80 %
Neutro Abs: 16.3 10*3/uL — ABNORMAL HIGH (ref 1.7–7.7)
Platelets: 292 10*3/uL (ref 150–400)
RBC: 5.74 MIL/uL (ref 4.22–5.81)
RDW: 12.8 % (ref 11.5–15.5)
WBC: 20.6 10*3/uL — ABNORMAL HIGH (ref 4.0–10.5)

## 2017-05-30 LAB — I-STAT CG4 LACTIC ACID, ED
LACTIC ACID, VENOUS: 1.2 mmol/L (ref 0.5–1.9)
Lactic Acid, Venous: 1.26 mmol/L (ref 0.5–1.9)

## 2017-05-30 LAB — BASIC METABOLIC PANEL
Anion gap: 13 (ref 5–15)
BUN: 10 mg/dL (ref 6–20)
CHLORIDE: 92 mmol/L — AB (ref 101–111)
CO2: 24 mmol/L (ref 22–32)
CREATININE: 0.85 mg/dL (ref 0.61–1.24)
Calcium: 8.9 mg/dL (ref 8.9–10.3)
GFR calc Af Amer: >60 mL/min (ref >60)
GFR calc non Af Amer: >60 mL/min (ref >60)
GLUCOSE: 337 mg/dL — AB (ref 65–99)
Potassium: 3.6 mmol/L (ref 3.5–5.1)
Sodium: 129 mmol/L — ABNORMAL LOW (ref 135–145)

## 2017-05-30 MED ORDER — VANCOMYCIN HCL 10 G IV SOLR
2000.0000 mg | Freq: Once | INTRAVENOUS | Status: AC
  Administered 2017-05-30: 2000 mg via INTRAVENOUS
  Filled 2017-05-30: qty 2000

## 2017-05-30 MED ORDER — PIPERACILLIN-TAZOBACTAM 3.375 G IVPB 30 MIN: 3.3750 g | Freq: Once | INTRAVENOUS | Status: DC

## 2017-05-30 MED ORDER — VANCOMYCIN HCL 10 G IV SOLR
1250.0000 mg | Freq: Three times a day (TID) | INTRAVENOUS | Status: DC
  Administered 2017-05-31 – 2017-06-01 (×4): 1250 mg via INTRAVENOUS
  Filled 2017-05-30 (×7): qty 1250

## 2017-05-30 MED ORDER — SODIUM CHLORIDE 0.9 % IV BOLUS (SEPSIS)
1000.0000 mL | Freq: Once | INTRAVENOUS | Status: AC
  Administered 2017-05-30: 1000 mL via INTRAVENOUS

## 2017-05-30 MED ORDER — IOPAMIDOL (ISOVUE-300) INJECTION 61%
INTRAVENOUS | Status: AC
  Administered 2017-05-30: 100 mL
  Filled 2017-05-30: qty 100

## 2017-05-30 MED ORDER — IPRATROPIUM-ALBUTEROL 0.5-2.5 (3) MG/3ML IN SOLN
3.0000 mL | Freq: Once | RESPIRATORY_TRACT | Status: AC
  Administered 2017-05-31: 3 mL via RESPIRATORY_TRACT
  Filled 2017-05-30: qty 3

## 2017-05-30 MED ORDER — NICOTINE 21 MG/24HR TD PT24
21.0000 mg | MEDICATED_PATCH | Freq: Once | TRANSDERMAL | Status: AC
  Administered 2017-05-31: 21 mg via TRANSDERMAL
  Filled 2017-05-30: qty 1

## 2017-05-30 NOTE — Progress Notes (Signed)
Pharmacy Antibiotic Note  Shawn DunningsDavid E Patrick is a 47 y.o. male admitted on 05/30/2017 with cellulitis/abscess on right groin.  Pharmacy has been consulted for vancomycin dosing.  Plan: Load with 2 g IV and then start 1250 mg IV every 8 hours.  Goal trough 10-15.  Monitor for clinical progression, renal function, and trough as needed.  Height: 5\' 10"  (177.8 cm) Weight: 275 lb (124.7 kg) IBW/kg (Calculated) : 73  Temp (24hrs), Avg:97.6 F (36.4 C), Min:97.6 F (36.4 C), Max:97.6 F (36.4 C)  Recent Labs  Lab 05/30/17 1957  WBC 20.6*  CREATININE 0.85    Estimated Creatinine Clearance: 143.9 mL/min (by C-G formula based on SCr of 0.85 mg/dL).    No Known Allergies  Antimicrobials this admission Vancomycin 1/15 >>   Dose adjustments this admission: N/A  Microbiology results: None ordered at this time.  Follow up with any cultures.  Thank you for allowing pharmacy to be a part of this patient's care.  Harlow AsaAmber C Marizol Borror, PharmD 05/30/2017 9:00 PM

## 2017-05-30 NOTE — ED Notes (Signed)
Placed on O2 2 L [place]

## 2017-05-30 NOTE — ED Notes (Signed)
Back from CT. Alert, NAD, calm, interactive, resps e/u, speaking in clear complete sentences, no dyspnea noted, skin W&D, VSS, (denies: pain, sob, nausea, dizziness or visual changes).

## 2017-05-30 NOTE — ED Notes (Signed)
Alert, NAD, calm, interactive, resps e/u, speaking in clear complete sentences, no dyspnea noted, skin W&D, VSS, c/o L scrotal pain and swelling for 1 week, (denies: urinary sx, recent illness, sore throat, abd or back pain, sob, fever, NVD,  dizziness or visual changes). Reports h/o umbilical hernia.

## 2017-05-30 NOTE — ED Notes (Signed)
Pt. spo2 ranging from 87-91, put him on 2L

## 2017-05-30 NOTE — ED Provider Notes (Signed)
[facility] EMERGENCY DEPARTMENT Provider Note   CSN: [id number] Arrival date & time: 05/30/17  1911     History   Chief Complaint No chief complaint on file.   HPI Shawn Patrick is a 47 y.o. male.  HPI Shawn Patrick is a 47 y.o. male with history of hypertension, sent to emergency department complaining of pain and swelling to his scrotum.  Patient states that he had a "cyst" to the left groin for the last several days.  He states he normally gets them and he is able to "pop them and then they go away."  He states that this cyst never "came to ahead."  He states that this morning at work, he felt more severe pain in his left testicle, and later was in the shower noticed that his scrotum was swollen.  He reports swelling in bilateral scrotum.  He reports pain mainly on the left side.  He denies any fever or chills, however he has had some nausea.  No treatment prior to coming in.  No history of the same.  No urinary symptoms.  Past Medical History:  Diagnosis Date  . Hypertension   . Obesity     There are no active problems to display for this patient.   History reviewed. No pertinent surgical history.     Home Medications    Prior to Admission medications   Medication Sig Start Date End Date Taking? Authorizing Provider  naproxen (NAPROSYN) 500 MG tablet Take 1 tablet (500 mg total) by mouth 2 (two) times daily. 02/25/17   Isa Rankin, MD    Family History History reviewed. No pertinent family history.  Social History Social History   Tobacco Use  . Smoking status: Current Every Day Smoker    Packs/day: 1.00    Types: Cigarettes  . Smokeless tobacco: Never Used  Substance Use Topics  . Alcohol use: No  . Drug use: No    Comment: h/o smoking marijuana quit     Allergies   Patient has no known allergies.   Review of Systems Review of Systems  Constitutional: Negative for chills and fever.  Respiratory: Negative for cough,  chest tightness and shortness of breath.   Cardiovascular: Negative for chest pain, palpitations and leg swelling.  Gastrointestinal: Positive for abdominal pain and nausea. Negative for abdominal distention, diarrhea and vomiting.  Genitourinary: Positive for scrotal swelling and testicular pain. Negative for dysuria, frequency, hematuria and urgency.  Musculoskeletal: Negative for arthralgias, myalgias, neck pain and neck stiffness.  Skin: Negative for rash.  Allergic/Immunologic: Negative for immunocompromised state.  Neurological: Negative for dizziness, weakness, light-headedness, numbness and headaches.  All other systems reviewed and are negative.    Physical Exam Updated Vital Signs BP 136/76 (BP Location: Right Arm)   Pulse 93   Temp 97.6 F (36.4 C) (Oral)   Resp 18   Ht 5\' 10"  (1.778 m)   Wt 124.7 kg (275 lb)   SpO2 91%   BMI 39.46 kg/m   Physical Exam  Constitutional: He is oriented to person, place, and time. He appears well-developed and well-nourished. No distress.  HENT:  Head: Normocephalic and atraumatic.  Eyes: Conjunctivae are normal.  Neck: Neck supple.  Cardiovascular: Normal rate, regular rhythm and normal heart sounds.  Pulmonary/Chest: Effort normal. No respiratory distress. He has no wheezes. He has no rales.  Abdominal: Soft. Bowel sounds are normal. He exhibits no distension. There is no tenderness. There is no rebound.  Genitourinary:  Genitourinary Comments: Swelling to bilateral scrotum, left greater than right.  There is erythema and induration to the skin overlying bilateral scrotum.  There is area of tenderness and fluctuance to the lateral left scrotum.  No drainage.  Musculoskeletal: He exhibits no edema.  Neurological: He is alert and oriented to person, place, and time.  Skin: Skin is warm and dry.  Nursing note and vitals reviewed.    ED Treatments / Results  Labs (all labs ordered are listed, but only abnormal results are  displayed) Labs Reviewed  CBC WITH DIFFERENTIAL/PLATELET - Abnormal; Notable for the following components:      Result Value   WBC 20.6 (*)    Hemoglobin 17.2 (*)    Neutro Abs 16.3 (*)    Monocytes Absolute 1.3 (*)    All other components within normal limits  BASIC METABOLIC PANEL - Abnormal; Notable for the following components:   Sodium 129 (*)    Chloride 92 (*)    Glucose, Bld 337 (*)    All other components within normal limits  CULTURE, BLOOD (ROUTINE X 2)  CULTURE, BLOOD (ROUTINE X 2)  URINALYSIS, ROUTINE W REFLEX MICROSCOPIC  I-STAT CG4 LACTIC ACID, ED  I-STAT CG4 LACTIC ACID, ED    EKG  EKG Interpretation None       Radiology Ct Pelvis W Contrast  Result Date: 05/30/2017 CLINICAL DATA:  Testicular pain and swelling with abscess to the right groin EXAM: CT PELVIS WITH CONTRAST TECHNIQUE: Multidetector CT imaging of the pelvis was performed using the standard protocol following the bolus administration of intravenous contrast. CONTRAST:  ISOVUE-300 IOPAMIDOL (ISOVUE-300) INJECTION 61% COMPARISON:  CT abdomen pelvis 01/27/2016 FINDINGS: Urinary Tract:  No abnormality visualized. Bowel:  Unremarkable visualized pelvic bowel loops. Vascular/Lymphatic: Mild aortic atherosclerosis. No aneurysmal dilatation. Small external iliac nodes, measuring 8 mm on the right and 7 mm on the left. Reproductive: Large scrotal fluid collections. Fatty inguinal hernias. Asymmetric edema and skin thickening to the left scrotum with focal nodular soft tissue thickening present, series 3, image number 64. No well-defined rim enhancing fluid collection. No soft tissue gas. Prostate unremarkable Other: Moderate fatty umbilical and periumbilical hernia. Negative for free fluid in the pelvis Musculoskeletal: No suspicious bone lesions identified. IMPRESSION: 1. Moderate to large scrotal fluid collections. Asymmetric edema and soft tissue thickening in the upper left scrotum, suspicious for a  phlegmon; no well-defined rim enhancing soft tissue abscess noted by CT 2. Mild external iliac adenopathy likely reactive Electronically Signed   By: Jasmine Pang M.D.   On: 05/30/2017 23:39    Procedures Procedures (including critical care time)  Medications Ordered in ED Medications - No data to display   Initial Impression / Assessment and Plan / ED Course  I have reviewed the triage vital signs and the nursing notes.  Pertinent labs & imaging results that were available during my care of the patient were reviewed by me and considered in my medical decision making (see chart for details).    Patient to the emergency department with bilateral testicular swelling, erythema and induration noted to the skin with some fluctuance to the left lateral testicle.  Suspect this is most likely an infectious process with diffuse cellulitis and deep abscess.  Will get CT for further evaluation.  Antibiotics started.  Blood work pending.  11:55 PM Pt became hypoxic while in ED. No wheezing on exam. Pt is a heavy smoker, requesting nicotine patch, will order one. DUOneb ordered.  Patient CT scan does  not show any drainable fluid collection, it is suspicious for cellulitis.  Patient already received antibiotics.  I am still concerned about possible abscess given the exam, and will obtain ultrasound.  Will also rule out torsion. Will call medicine for admission.   12:06 AM Spoke with medicine. Will call urology per their request  12:16 AM [place] with Dr. Berneice HeinrichManny who reviewed CT scan.  Advised to continue antibiotics.  No surgical intervention needed at this time.  He will consult in the morning.  Vitals:   05/30/17 2230 05/30/17 2245 05/30/17 2300 05/30/17 2345  BP: (!) 142/75 136/71 (!) 146/88 (!) 125/91  Pulse: 97 86 95 87  Resp:      Temp:      TempSrc:      SpO2: 94% 94% 92% (!) 89%  Weight:      Height:         Final Clinical Impressions(s) / ED Diagnoses   Final diagnoses:  Cellulitis  of scrotum  Hyperglycemia    ED Discharge Orders    None       Jaynie CrumbleKirichenko, Jaquawn Saffran, PA-C 05/31/17 0017    Linwood DibblesKnapp, Jon, MD 05/31/17 [phone]

## 2017-05-30 NOTE — ED Triage Notes (Signed)
Pt presents with onset of bilateral testicular swelling that he noted after work when in the shower. He reports onset of pain to R testicle "like someone kicked me in my groin" Pt reports report abscess x 1 week to R groin that has not drained.

## 2017-05-31 ENCOUNTER — Other Ambulatory Visit: Payer: Self-pay

## 2017-05-31 ENCOUNTER — Emergency Department (HOSPITAL_COMMUNITY): Payer: Self-pay

## 2017-05-31 ENCOUNTER — Encounter (HOSPITAL_COMMUNITY): Payer: Self-pay | Admitting: Internal Medicine

## 2017-05-31 ENCOUNTER — Inpatient Hospital Stay (HOSPITAL_COMMUNITY): Payer: Self-pay

## 2017-05-31 DIAGNOSIS — L0390 Cellulitis, unspecified: Secondary | ICD-10-CM | POA: Diagnosis present

## 2017-05-31 DIAGNOSIS — N492 Inflammatory disorders of scrotum: Secondary | ICD-10-CM | POA: Diagnosis present

## 2017-05-31 DIAGNOSIS — R739 Hyperglycemia, unspecified: Secondary | ICD-10-CM | POA: Diagnosis present

## 2017-05-31 LAB — BRAIN NATRIURETIC PEPTIDE: B Natriuretic Peptide: 12.8 pg/mL (ref 0.0–100.0)

## 2017-05-31 LAB — CBC
HCT: 46.9 % (ref 39.0–52.0)
HEMOGLOBIN: 15.9 g/dL (ref 13.0–17.0)
MCH: 29.6 pg (ref 26.0–34.0)
MCHC: 33.9 g/dL (ref 30.0–36.0)
MCV: 87.3 fL (ref 78.0–100.0)
Platelets: 260 10*3/uL (ref 150–400)
RBC: 5.37 MIL/uL (ref 4.22–5.81)
RDW: 13.2 % (ref 11.5–15.5)
WBC: 16.2 10*3/uL — ABNORMAL HIGH (ref 4.0–10.5)

## 2017-05-31 LAB — CBG MONITORING, ED
GLUCOSE-CAPILLARY: 278 mg/dL — AB (ref 65–99)
Glucose-Capillary: 301 mg/dL — ABNORMAL HIGH (ref 65–99)

## 2017-05-31 LAB — URINALYSIS, ROUTINE W REFLEX MICROSCOPIC
Bacteria, UA: NONE SEEN
Bilirubin Urine: NEGATIVE
Glucose, UA: >=500 mg/dL — AB
Hgb urine dipstick: NEGATIVE
Ketones, ur: NEGATIVE mg/dL
LEUKOCYTES UA: NEGATIVE
Nitrite: NEGATIVE
Protein, ur: 30 mg/dL — AB
SQUAMOUS EPITHELIAL / LPF: NONE SEEN
pH: 5 (ref 5.0–8.0)

## 2017-05-31 LAB — I-STAT ARTERIAL BLOOD GAS, ED
Acid-Base Excess: 1 mmol/L (ref 0.0–2.0)
Bicarbonate: 29.5 mmol/L — ABNORMAL HIGH (ref 20.0–28.0)
O2 Saturation: 90 %
PCO2 ART: 60.5 mmHg — AB (ref 32.0–48.0)
PH ART: 7.295 — AB (ref 7.350–7.450)
TCO2: 31 mmol/L (ref 22–32)
pO2, Arterial: 68 mmHg — ABNORMAL LOW (ref 83.0–108.0)

## 2017-05-31 LAB — BASIC METABOLIC PANEL
ANION GAP: 12 (ref 5–15)
BUN: 10 mg/dL (ref 6–20)
CHLORIDE: 95 mmol/L — AB (ref 101–111)
CO2: 24 mmol/L (ref 22–32)
Calcium: 8.4 mg/dL — ABNORMAL LOW (ref 8.9–10.3)
Creatinine, Ser: 0.78 mg/dL (ref 0.61–1.24)
GFR calc non Af Amer: >60 mL/min (ref >60)
GLUCOSE: 328 mg/dL — AB (ref 65–99)
Potassium: 3.8 mmol/L (ref 3.5–5.1)
Sodium: 131 mmol/L — ABNORMAL LOW (ref 135–145)

## 2017-05-31 LAB — HEMOGLOBIN A1C
HEMOGLOBIN A1C: 13.5 % — AB (ref 4.8–5.6)
MEAN PLASMA GLUCOSE: 340.75 mg/dL

## 2017-05-31 LAB — HIV ANTIBODY (ROUTINE TESTING W REFLEX): HIV Screen 4th Generation wRfx: NONREACTIVE

## 2017-05-31 LAB — MRSA PCR SCREENING: MRSA BY PCR: NEGATIVE

## 2017-05-31 LAB — GC/CHLAMYDIA PROBE AMP (~~LOC~~) NOT AT ARMC
Chlamydia: NEGATIVE
NEISSERIA GONORRHEA: NEGATIVE

## 2017-05-31 LAB — GLUCOSE, CAPILLARY
GLUCOSE-CAPILLARY: 259 mg/dL — AB (ref 65–99)
Glucose-Capillary: 237 mg/dL — ABNORMAL HIGH (ref 65–99)

## 2017-05-31 LAB — TROPONIN I: Troponin I: <0.03 ng/mL (ref <0.03)

## 2017-05-31 MED ORDER — PIPERACILLIN-TAZOBACTAM 3.375 G IVPB 30 MIN
3.3750 g | Freq: Three times a day (TID) | INTRAVENOUS | Status: DC
  Administered 2017-05-31 – 2017-06-02 (×7): 3.375 g via INTRAVENOUS
  Filled 2017-05-31 (×10): qty 50

## 2017-05-31 MED ORDER — ONDANSETRON HCL 4 MG/2ML IJ SOLN: 4.0000 mg | Freq: Four times a day (QID) | INTRAMUSCULAR | Status: DC | PRN

## 2017-05-31 MED ORDER — INFLUENZA VAC SPLIT QUAD 0.5 ML IM SUSY
0.5000 mL | PREFILLED_SYRINGE | INTRAMUSCULAR | Status: AC
  Administered 2017-06-02: 0.5 mL via INTRAMUSCULAR
  Filled 2017-05-31: qty 0.5

## 2017-05-31 MED ORDER — INSULIN GLARGINE 100 UNIT/ML ~~LOC~~ SOLN
10.0000 [IU] | Freq: Every day | SUBCUTANEOUS | Status: DC
  Administered 2017-05-31: 10 [IU] via SUBCUTANEOUS
  Filled 2017-05-31: qty 0.1

## 2017-05-31 MED ORDER — INSULIN GLARGINE 100 UNIT/ML ~~LOC~~ SOLN
20.0000 [IU] | Freq: Every day | SUBCUTANEOUS | Status: DC
  Administered 2017-05-31: 20 [IU] via SUBCUTANEOUS
  Filled 2017-05-31: qty 0.2

## 2017-05-31 MED ORDER — ACETAMINOPHEN 650 MG RE SUPP: 650.0000 mg | Freq: Four times a day (QID) | RECTAL | Status: DC | PRN

## 2017-05-31 MED ORDER — SODIUM CHLORIDE 0.9 % IV SOLN
INTRAVENOUS | Status: DC
  Administered 2017-05-31: 02:00:00 via INTRAVENOUS

## 2017-05-31 MED ORDER — ACETAMINOPHEN 325 MG PO TABS
650.0000 mg | ORAL_TABLET | Freq: Once | ORAL | Status: AC
  Administered 2017-05-31: 650 mg via ORAL
  Filled 2017-05-31: qty 2

## 2017-05-31 MED ORDER — INSULIN ASPART 100 UNIT/ML ~~LOC~~ SOLN: 0.0000 [IU] | SUBCUTANEOUS | Status: DC

## 2017-05-31 MED ORDER — PNEUMOCOCCAL VAC POLYVALENT 25 MCG/0.5ML IJ INJ
0.5000 mL | INJECTION | INTRAMUSCULAR | Status: AC
  Administered 2017-06-02: 0.5 mL via INTRAMUSCULAR
  Filled 2017-05-31: qty 0.5

## 2017-05-31 MED ORDER — INSULIN ASPART 100 UNIT/ML ~~LOC~~ SOLN
0.0000 [IU] | Freq: Three times a day (TID) | SUBCUTANEOUS | Status: DC
  Administered 2017-05-31: 5 [IU] via SUBCUTANEOUS
  Filled 2017-05-31: qty 1

## 2017-05-31 MED ORDER — LIVING WELL WITH DIABETES BOOK
Freq: Once | Status: AC
  Administered 2017-05-31: 18:00:00
  Filled 2017-05-31 (×2): qty 1

## 2017-05-31 MED ORDER — ONDANSETRON HCL 4 MG PO TABS: 4.0000 mg | ORAL_TABLET | Freq: Four times a day (QID) | ORAL | Status: DC | PRN

## 2017-05-31 MED ORDER — CEFAZOLIN SODIUM-DEXTROSE 1-4 GM/50ML-% IV SOLN
1.0000 g | Freq: Once | INTRAVENOUS | Status: AC
  Administered 2017-05-31: 1 g via INTRAVENOUS
  Filled 2017-05-31: qty 50

## 2017-05-31 MED ORDER — INSULIN ASPART 100 UNIT/ML ~~LOC~~ SOLN
0.0000 [IU] | SUBCUTANEOUS | Status: DC
  Administered 2017-05-31: 8 [IU] via SUBCUTANEOUS
  Administered 2017-05-31: 11 [IU] via SUBCUTANEOUS
  Administered 2017-05-31: 5 [IU] via SUBCUTANEOUS
  Administered 2017-06-01 (×2): 3 [IU] via SUBCUTANEOUS
  Administered 2017-06-01 (×2): 5 [IU] via SUBCUTANEOUS
  Administered 2017-06-01 (×2): 3 [IU] via SUBCUTANEOUS
  Administered 2017-06-02: 2 [IU] via SUBCUTANEOUS
  Administered 2017-06-02 (×2): 3 [IU] via SUBCUTANEOUS
  Administered 2017-06-03: 2 [IU] via SUBCUTANEOUS

## 2017-05-31 MED ORDER — ACETAMINOPHEN 325 MG PO TABS
650.0000 mg | ORAL_TABLET | Freq: Four times a day (QID) | ORAL | Status: DC | PRN
  Administered 2017-05-31: 650 mg via ORAL
  Filled 2017-05-31: qty 2

## 2017-05-31 MED ORDER — SODIUM CHLORIDE 0.9 % IV SOLN
INTRAVENOUS | Status: DC
  Administered 2017-05-31: 13:00:00 via INTRAVENOUS

## 2017-05-31 NOTE — ED Notes (Signed)
Urology @ bedside

## 2017-05-31 NOTE — ED Notes (Signed)
Back from US, no changes. Alert, NAD, calm, no dyspnea, interactive.

## 2017-05-31 NOTE — ED Notes (Signed)
Remains in US.

## 2017-05-31 NOTE — ED Notes (Signed)
Called X-ray for stat order

## 2017-05-31 NOTE — ED Notes (Signed)
Xray bedside.

## 2017-05-31 NOTE — ED Notes (Signed)
Requested Vancomycin from pharmacy

## 2017-05-31 NOTE — Consult Note (Signed)
Reason for Consult: Scrotal Cellulitis  Referring Physician: Damien Fusi PA  Shawn Patrick is an 47 y.o. male.   HPI:   1 - Scrotal Cellulitis - few days of increasing scrotal tenderness, malaise, swelling 05/2017. CT with impressive soft tissue edema / scrotal wall thickening w/o fluid collection / abscess / soft tissue gas.  [place] w/o testicular problems (orchitis, torsion). No hernais. He is non-compliant diabetic. Leukocytosis to 20k.   PMH sig for obesity, DM2 (non-compliant, never treated, A1c 13).  Today "Shawn Patrick" is seen in consultation for above.    Past Medical History:  Diagnosis Date  . Hypertension   . Obesity     History reviewed. No pertinent surgical history.  History reviewed. No pertinent family history.  Social History:  reports that he has been smoking cigarettes.  He has been smoking about 1.00 pack per day. he has never used smokeless tobacco. He reports that he does not drink alcohol or use drugs.  Allergies: No Known Allergies  Medications: I have reviewed the patient's current medications.  Results for orders placed or performed during the hospital encounter of 05/30/17 (from the past 48 hour(s))  CBC with Differential     Status: Abnormal   Collection Time: 05/30/17  7:57 PM  Result Value Ref Range   WBC 20.6 (H) 4.0 - 10.5 K/uL   RBC 5.74 4.22 - 5.81 MIL/uL   Hemoglobin 17.2 (H) 13.0 - 17.0 g/dL   HCT 49.4 39.0 - 52.0 %   MCV 86.1 78.0 - 100.0 fL   MCH 30.0 26.0 - 34.0 pg   MCHC 34.8 30.0 - 36.0 g/dL   RDW 12.8 11.5 - 15.5 %   Platelets 292 150 - 400 K/uL   Neutrophils Relative % 80 %   Neutro Abs 16.3 (H) 1.7 - 7.7 K/uL   Lymphocytes Relative 13 %   Lymphs Abs 2.6 0.7 - 4.0 K/uL   Monocytes Relative 6 %   Monocytes Absolute 1.3 (H) 0.1 - 1.0 K/uL   Eosinophils Relative 1 %   Eosinophils Absolute 0.3 0.0 - 0.7 K/uL   Basophils Relative 0 %   Basophils Absolute 0.1 0.0 - 0.1 K/uL  Basic metabolic panel     Status: Abnormal   Collection Time: 05/30/17  7:57 PM  Result Value Ref Range   Sodium 129 (L) 135 - 145 mmol/L   Potassium 3.6 3.5 - 5.1 mmol/L   Chloride 92 (L) 101 - 111 mmol/L   CO2 24 22 - 32 mmol/L   Glucose, Bld 337 (H) 65 - 99 mg/dL   BUN 10 6 - 20 mg/dL   Creatinine, Ser 0.85 0.61 - 1.24 mg/dL   Calcium 8.9 8.9 - 10.3 mg/dL   GFR calc non Af Amer >60 >60 mL/min   GFR calc Af Amer >60 >60 mL/min    Comment: (NOTE) The eGFR has been calculated using the CKD EPI equation. This calculation has not been validated in all clinical situations. eGFR's persistently <60 mL/min signify possible Chronic Kidney Disease.    Anion gap 13 5 - 15  I-Stat CG4 Lactic Acid, ED     Status: None   Collection Time: 05/30/17  9:40 PM  Result Value Ref Range   Lactic Acid, Venous 1.20 0.5 - 1.9 mmol/L  I-Stat CG4 Lactic Acid, ED     Status: None   Collection Time: 05/30/17 10:29 PM  Result Value Ref Range   Lactic Acid, Venous 1.26 0.5 - 1.9 mmol/L    Ct  Pelvis W Contrast  Result Date: 05/30/2017 CLINICAL DATA:  Testicular pain and swelling with abscess to the right groin EXAM: CT PELVIS WITH CONTRAST TECHNIQUE: Multidetector CT imaging of the pelvis was performed using the standard protocol following the bolus administration of intravenous contrast. CONTRAST:  18m ISOVUE-300 IOPAMIDOL (ISOVUE-300) INJECTION 61% COMPARISON:  CT abdomen pelvis 01/27/2016 FINDINGS: Urinary Tract:  No abnormality visualized. Bowel:  Unremarkable visualized pelvic bowel loops. Vascular/Lymphatic: Mild aortic atherosclerosis. No aneurysmal dilatation. Small external iliac nodes, measuring 8 mm on the right and 7 mm on the left. Reproductive: Large scrotal fluid collections. Fatty inguinal hernias. Asymmetric edema and skin thickening to the left scrotum with focal nodular soft tissue thickening present, series 3, image number 64. No well-defined rim enhancing fluid collection. No soft tissue gas. Prostate unremarkable Other: Moderate fatty  umbilical and periumbilical hernia. Negative for free fluid in the pelvis Musculoskeletal: No suspicious bone lesions identified. IMPRESSION: 1. Moderate to large scrotal fluid collections. Asymmetric edema and soft tissue thickening in the upper left scrotum, suspicious for a phlegmon; no well-defined rim enhancing soft tissue abscess noted by CT 2. Mild external iliac adenopathy likely reactive Electronically Signed   By: KDonavan FoilM.D.   On: 05/30/2017 23:39    Review of Systems  Constitutional: Positive for fever and malaise/fatigue.  HENT: Negative.   Eyes: Negative.   Respiratory: Negative.   Cardiovascular: Negative.   Gastrointestinal: Negative.   Genitourinary: Positive for frequency. Negative for flank pain and hematuria.  Musculoskeletal: Negative.   Skin: Negative.   Neurological: Negative.   Endo/Heme/Allergies: Negative.   Psychiatric/Behavioral: Negative.    Blood pressure (!) 125/91, pulse 87, temperature 99.9 F (37.7 C), temperature source Oral, resp. rate 18, height _0  (1.778 m), weight 124.7 kg (275 lb), SpO2 (!) 89 %. Physical Exam  Constitutional: He appears well-developed.  HENT:  Head: Normocephalic.  Eyes: Pupils are equal, round, and reactive to light.  Neck: Normal range of motion.  Cardiovascular: Normal rate.  Respiratory: Effort normal.  On CPAP at present.   GI: Soft.  Moderate truncal obesity  Genitourinary:  Genitourinary Comments: Buried penis due to fat pad. L>R soft scrtotal edema and erythema that is painful to tough. NO fluctuence, drainage, crepitus.   Neurological: He is alert.  Skin: Skin is warm.  Psychiatric: He has a normal mood and affect.    Assessment/Plan:   1 - Scrotal Cellulitis - impressive case, but no surgical indications. This will likely improve with glycemic control and IV ABX. Rec empiric MRSA coverage as well as initial gram negative coverage with Vanc/Zosyn. Will follow to verify does not progress to  necrotizing infection / require surgical debridement.   Importance of diabetes management STRONGLY reiterated to patient who voiced understanding.   Greatly appreciate hospitalist comangement. Please call me directly with questions anytime.     MANNY, THEODORE 05/31/2017, 12:15 AM

## 2017-05-31 NOTE — ED Notes (Signed)
No changes. Pt eating.

## 2017-05-31 NOTE — Progress Notes (Signed)
  PROGRESS NOTE  Patient admitted earlier this morning. See H&P. Damita DunningsDavid E Deberry is a 47 y.o. male with history of tobacco abuse who has not been to a physician for many years presents to the ER with complaints of worsening pain and swelling of the scrotal area.  Patient states that he noticed a small knot-like area in the left groin area for last 1 week. Day prior to admission, it became suddenly worse and involving the whole scrotum mostly on the left side with erythema and pain. In the ER patient had CT pelvis done which shows features concerning for developing phlegmon. Urology was consulted and was recommended for IV antibiotics and blood sugar control.   I was called by RN regarding patient desatting on room air into the 70s. Overnight, while he was sleeping, he would often desaturate and improve when awakened. He stated that he was recommended to wear CPAP as outpatient which he has not been compliant with. He denies chest pain or shortness of breath. Stat ABG was obtained which was 7.295/60.5. BiPAP initiated and patient's status changed to stepdown unit.   Ha1c 13.5. He needs insulin on discharge. Continue to adjust insulin dosing while inpatient. DM coordinator consult.   Continue IV vanco/zosyn. IVF. Gonorrhea/Chlamydia pending. Urology following.    Noralee StainJennifer Bazil Dhanani, DO [facility] [email or web address] Password [email or web address] 05/31/2017, 11:13 AM

## 2017-05-31 NOTE — ED Notes (Signed)
Pt removed self from bipap, stated he needed to use restroom. Pt ambulatory to restroom, no assistance needed per pt. Monitored pt SpO2 while ambulating, pt at 90% on room air. Pt placed back on bipap and is resting in bed at this time.

## 2017-05-31 NOTE — ED Notes (Signed)
Respiratory here w/ Cpap.  Pt O2 was dropping in to the low 70's as he slept.  Provider notified and ordered Cpap.

## 2017-05-31 NOTE — ED Notes (Signed)
Dr. Alvino Chapelhoi made aware of pt spo2 dropping to 70's-80's while eating or speaking. Pt states he does not feel SOB when this occurs but lips do appear dusky. Pt placed on 3lpmnc.

## 2017-05-31 NOTE — Progress Notes (Signed)
Inpatient Diabetes Program Recommendations  AACE/ADA: New Consensus Statement on Inpatient Glycemic Control (2015)  Target Ranges:  Prepandial:   less than 140 mg/dL      Peak postprandial:   less than 180 mg/dL (1-2 hours)      Critically ill patients:  140 - 180 mg/dL   Lab Results  Component Value Date   GLUCAP 301 (H) 05/31/2017   HGBA1C 13.5 (H) 05/30/2017    Review of Glycemic Control  Diabetes history: New Diagnosis this admission Current orders for Inpatient glycemic control: Lantus 20 units QHS (pt to get tonight), Novolog Moderate Correction 0-15 units Q4hours  Inpatient Diabetes Program Recommendations:     Went to see patient, however patient status changed and was placed on BiPap. Will not see today but will wait until tomorrow and recheck to see if patient is appropriate for education, with hopefully other family present.  Thanks, Christena DeemShannon Priyah Schmuck RN, MSN, [facility] Inpatient Diabetes Coordinator Team Pager [phone] (8a-5p)

## 2017-05-31 NOTE — ED Notes (Signed)
Pt sitting in bed eating at this time.  

## 2017-05-31 NOTE — ED Notes (Signed)
Pt to US at this time, no changes, alert, NAD, calm, interactive, VSS.

## 2017-05-31 NOTE — H&P (Addendum)
History and Physical    Shawn Patrick, Shawn Patrick DOA: 05/30/2017  PCP: Patient, No Pcp Per  Patient coming from: Home.  Chief Complaint: Scrotal pain and swelling.  HPI: Shawn Patrick is a 47 y.o. male with history of tobacco abuse who has not been to a physician for many years presents to the ER with complaints of worsening pain and swelling of the scrotal area.  Patient states that he noticed a small knot-like area in the left groin area for last 1 week.  Yesterday morning it became suddenly worse and involving the whole scrotum mostly on the left side with erythema pain.  Also had subjective feeling of fever chills.  Denies any dysuria or penile discharge.  Patient is on amoxicillin last 2 days for his tooth extraction which is planned this week.  Patient otherwise denies any chest pain or shortness of breath nausea vomiting or diarrhea.  ED Course: In the ER patient had CT pelvis done which shows features concerning for developing phlegmon.  Patient also has scrotal ultrasound.  ER physician had discussed with urologist Dr. Berneice Heinrich.  Plan at this time is to keep patient on IV antibiotics and observe clinically.  Patient's blood sugar is also elevated at 337.  Patient does not have any history of diabetes previously.  Review of Systems: As per HPI, rest all negative.   Past Medical History:  Diagnosis Date  . Hypertension   . Obesity     History reviewed. No pertinent surgical history.   reports that he has been smoking cigarettes.  He has been smoking about 1.00 pack per day. he has never used smokeless tobacco. He reports that he does not drink alcohol or use drugs.  No Known Allergies  Family History  Problem Relation Age of Onset  . Hypertension Other     Prior to Admission medications   Medication Sig Start Date End Date Taking? Authorizing Provider  acetaminophen (TYLENOL) 500 MG tablet Take 500 mg by mouth every 6 (six) hours as needed (for pain or  headaches).    Yes [provider]  amoxicillin (AMOXIL) 500 MG capsule Take 500 mg by mouth 3 (three) times daily. FOR 10  DAYS 05/29/17 06/07/17 Yes [provider]  Ibuprofen-Diphenhydramine HCl (ADVIL PM) 200-25 MG CAPS Take 1-2 capsules by mouth at bedtime as needed (for sleep).   Yes [provider]  naproxen sodium (ALEVE) 220 MG tablet Take 220-440 mg by mouth daily as needed (for pain or headaches).   Yes [provider]  naproxen (NAPROSYN) 500 MG tablet Take 1 tablet (500 mg total) by mouth 2 (two) times daily. Patient not taking: Reported on 05/30/2017 02/25/17   Isa Rankin, MD    Physical Exam: Vitals:   05/30/17 2230 05/30/17 2245 05/30/17 2300 05/30/17 2345  BP: (!) 142/75 136/71 (!) 146/88 (!) 125/91  Pulse: 97 86 95 87  Resp:      Temp:      TempSrc:      SpO2: 94% 94% 92% (!) 89%  Weight:      Height:          Constitutional: Moderately built and nourished. Vitals:   05/30/17 2230 05/30/17 2245 05/30/17 2300 05/30/17 2345  BP: (!) 142/75 136/71 (!) 146/88 (!) 125/91  Pulse: 97 86 95 87  Resp:      Temp:      TempSrc:      SpO2: 94% 94% 92% (!) 89%  Weight:  Height:       Eyes: Anicteric no pallor. ENMT: No discharge from the ears eyes nose or mouth. Neck: No mass felt.  No JVD appreciated. Respiratory: No rhonchi or crepitations. Cardiovascular: S1-S2 heard no murmurs appreciated. Abdomen: Soft nontender bowel sounds present.  Scrotal area is erythematous tender to touch but no obvious crepitation seen. Musculoskeletal: No edema.  No joint effusion. Skin: Scrotal skin is erythematous tender to touch but no obvious crepitations.  No penile discharge seen. Neurologic: Alert awake oriented to time place and person.  Moves all extremities. Psychiatric: Appears normal.  Normal affect.   Labs on Admission: I have personally reviewed following labs and imaging studies  CBC: Recent Labs  Lab 05/30/17 1957    WBC 20.6*  NEUTROABS 16.3*  HGB 17.2*  HCT 49.4  MCV 86.1  PLT 292   Basic Metabolic Panel: Recent Labs  Lab 05/30/17 1957  NA 129*  K 3.6  CL 92*  CO2 24  GLUCOSE 337*  BUN 10  CREATININE 0.85  CALCIUM 8.9   GFR: Estimated Creatinine Clearance: 143.9 mL/min (by C-G formula based on SCr of 0.85 mg/dL). Liver Function Tests: No results for input(s): AST, ALT, ALKPHOS, BILITOT, PROT, ALBUMIN in the last 168 hours. No results for input(s): LIPASE, AMYLASE in the last 168 hours. No results for input(s): AMMONIA in the last 168 hours. Coagulation Profile: No results for input(s): INR, PROTIME in the last 168 hours. Cardiac Enzymes: No results for input(s): CKTOTAL, CKMB, CKMBINDEX, TROPONINI in the last 168 hours. BNP (last 3 results) No results for input(s): PROBNP in the last 8760 hours. HbA1C: No results for input(s): HGBA1C in the last 72 hours. CBG: No results for input(s): GLUCAP in the last 168 hours. Lipid Profile: No results for input(s): CHOL, HDL, LDLCALC, TRIG, CHOLHDL, LDLDIRECT in the last 72 hours. Thyroid Function Tests: No results for input(s): TSH, T4TOTAL, FREET4, T3FREE, THYROIDAB in the last 72 hours. Anemia Panel: No results for input(s): VITAMINB12, FOLATE, FERRITIN, TIBC, IRON, RETICCTPCT in the last 72 hours. Urine analysis:    Component Value Date/Time   COLORURINE YELLOW 01/27/2016 1532   APPEARANCEUR CLEAR 01/27/2016 1532   LABSPEC 1.046 (H) 01/27/2016 1532   PHURINE 5.5 01/27/2016 1532   GLUCOSEU >1000 (A) 01/27/2016 1532   HGBUR NEGATIVE 01/27/2016 1532   BILIRUBINUR NEGATIVE 01/27/2016 1532   KETONESUR 15 (A) 01/27/2016 1532   PROTEINUR NEGATIVE 01/27/2016 1532   UROBILINOGEN 0.2 01/12/2015 1036   NITRITE NEGATIVE 01/27/2016 1532   LEUKOCYTESUR NEGATIVE 01/27/2016 1532   Sepsis Labs: @LABRCNTIP (procalcitonin:4,lacticidven:4) )No results found for this or any previous visit (from the past 240 hour(s)).   Radiological Exams on  Admission: Ct Pelvis W Contrast  Result Date: 05/30/2017 CLINICAL DATA:  Testicular pain and swelling with abscess to the right groin EXAM: CT PELVIS WITH CONTRAST TECHNIQUE: Multidetector CT imaging of the pelvis was performed using the standard protocol following the bolus administration of intravenous contrast. CONTRAST:  ISOVUE-300 IOPAMIDOL (ISOVUE-300) INJECTION 61% COMPARISON:  CT abdomen pelvis 01/27/2016 FINDINGS: Urinary Tract:  No abnormality visualized. Bowel:  Unremarkable visualized pelvic bowel loops. Vascular/Lymphatic: Mild aortic atherosclerosis. No aneurysmal dilatation. Small external iliac nodes, measuring 8 mm on the right and 7 mm on the left. Reproductive: Large scrotal fluid collections. Fatty inguinal hernias. Asymmetric edema and skin thickening to the left scrotum with focal nodular soft tissue thickening present, series 3, image number 64. No well-defined rim enhancing fluid collection. No soft tissue gas. Prostate unremarkable Other: Moderate fatty  umbilical and periumbilical hernia. Negative for free fluid in the pelvis Musculoskeletal: No suspicious bone lesions identified. IMPRESSION: 1. Moderate to large scrotal fluid collections. Asymmetric edema and soft tissue thickening in the upper left scrotum, suspicious for a phlegmon; no well-defined rim enhancing soft tissue abscess noted by CT 2. Mild external iliac adenopathy likely reactive Electronically Signed   By: Jasmine Pang M.D.   On: 05/30/2017 23:39   US Scrotum  Result Date: 05/31/2017 CLINICAL DATA:  Scrotal swelling for 1 week. EXAM: SCROTAL ULTRASOUND DOPPLER ULTRASOUND OF THE TESTICLES TECHNIQUE: Complete ultrasound examination of the testicles, epididymis, and other scrotal structures was performed. Color and spectral Doppler ultrasound were also utilized to evaluate blood flow to the testicles. COMPARISON:  05/30/2017 CT pelvis. FINDINGS: Right testicle Measurements: 4.6 x 2.5 x 3.1 cm. No mass or  microlithiasis visualized. Left testicle Measurements: 4.2 x 2.9 x 3.1 cm. No mass or microlithiasis visualized. Right epididymis: Simple 6 x 5 x 4 mm cyst versus spermatocele in the right epididymal head. Otherwise normal right epididymis. Left epididymis:  Normal in size and appearance. Hydrocele:  No significant hydrocele. Varicocele:  Mild left varicocele.  No right varicocele. Prominent diffuse subcutaneous edema throughout the scrotum, left greater than right. Pulsed Doppler interrogation of both testes demonstrates normal low resistance arterial and venous waveforms bilaterally. IMPRESSION: 1. No evidence of testicular torsion. Normal testes with no testicular mass. 2. Prominent diffuse subcutaneous edema throughout the scrotum, left greater than right. No significant hydroceles. 3. Mild left varicocele. 4. Simple subcentimeter cyst versus spermatocele in the right epididymal head. Otherwise normal epididymides. Electronically Signed   By: Delbert Phenix M.D.   On: 05/31/2017 01:05   [place] Art/ven Flow Abd Pelv Doppler  Result Date: 05/31/2017 CLINICAL DATA:  Scrotal swelling for 1 week. EXAM: SCROTAL ULTRASOUND DOPPLER ULTRASOUND OF THE TESTICLES TECHNIQUE: Complete ultrasound examination of the testicles, epididymis, and other scrotal structures was performed. Color and spectral Doppler ultrasound were also utilized to evaluate blood flow to the testicles. COMPARISON:  05/30/2017 CT pelvis. FINDINGS: Right testicle Measurements: 4.6 x 2.5 x 3.1 cm. No mass or microlithiasis visualized. Left testicle Measurements: 4.2 x 2.9 x 3.1 cm. No mass or microlithiasis visualized. Right epididymis: Simple 6 x 5 x 4 mm cyst versus spermatocele in the right epididymal head. Otherwise normal right epididymis. Left epididymis:  Normal in size and appearance. Hydrocele:  No significant hydrocele. Varicocele:  Mild left varicocele.  No right varicocele. Prominent diffuse subcutaneous edema throughout the scrotum, left  greater than right. Pulsed Doppler interrogation of both testes demonstrates normal low resistance arterial and venous waveforms bilaterally. IMPRESSION: 1. No evidence of testicular torsion. Normal testes with no testicular mass. 2. Prominent diffuse subcutaneous edema throughout the scrotum, left greater than right. No significant hydroceles. 3. Mild left varicocele. 4. Simple subcentimeter cyst versus spermatocele in the right epididymal head. Otherwise normal epididymides. Electronically Signed   By: Delbert Phenix M.D.   On: 05/31/2017 01:05     Assessment/Plan Principal Problem:   Cellulitis of scrotum Active Problems:   Hyperglycemia   Cellulitis    1. Cellulitis of the scrotum -CT scan is read as possible developing phlegmon.  Urologist on-call has been consulted.  I have placed patient on vancomycin and Zosyn.  Follow cultures UA and chlamydia and gonorrhea probe.  Continue with hydration. 2. Hyperglycemia -likely new onset diabetes mellitus type 2.  Will check hemoglobin A1c.  For now I have placed patient on Lantus 10 units and sliding  scale coverage. 3. Tobacco abuse -tobacco cessation counseling requested. 4. Hypoxia -after admission patient is found to be mildly hypoxic.  I have ordered stat chest x-ray BNP and troponin levels.  Patient is for now placed on CPAP. 5. Dental infection -patient has outpatient follow-up with dentist.  On antibiotics now.   DVT prophylaxis: SCDs. Code Status: Full code. Family Communication: Discussed with patient. Disposition Plan: Home. Consults called: Urology. Admission status: Inpatient.   Eduard ClosArshad N Sanjuanita Condrey MD [facility] Pager [phone].  If 7PM-7AM, please contact night-coverage [email or web address] Password [email or web address]  05/31/2017, 1:29 AM

## 2017-06-01 LAB — BLOOD GAS, ARTERIAL
Acid-Base Excess: 6 mmol/L — ABNORMAL HIGH (ref 0.0–2.0)
BICARBONATE: 32.2 mmol/L — AB (ref 20.0–28.0)
Drawn by: 345601
FIO2: 28
O2 Saturation: 88.6 %
PATIENT TEMPERATURE: 98.6
PO2 ART: 60.8 mmHg — AB (ref 83.0–108.0)
pCO2 arterial: 68.4 mmHg (ref 32.0–48.0)
pH, Arterial: 7.295 — ABNORMAL LOW (ref 7.350–7.450)

## 2017-06-01 LAB — VANCOMYCIN, TROUGH: Vancomycin Tr: 12 ug/mL — ABNORMAL LOW (ref 15–20)

## 2017-06-01 LAB — CBC
HCT: 47.7 % (ref 39.0–52.0)
Hemoglobin: 15.5 g/dL (ref 13.0–17.0)
MCH: 29.4 pg (ref 26.0–34.0)
MCHC: 32.5 g/dL (ref 30.0–36.0)
MCV: 90.3 fL (ref 78.0–100.0)
PLATELETS: 266 10*3/uL (ref 150–400)
RBC: 5.28 MIL/uL (ref 4.22–5.81)
RDW: 13.5 % (ref 11.5–15.5)
WBC: 12.5 10*3/uL — ABNORMAL HIGH (ref 4.0–10.5)

## 2017-06-01 LAB — BASIC METABOLIC PANEL
Anion gap: 10 (ref 5–15)
BUN: 8 mg/dL (ref 6–20)
CALCIUM: 8.5 mg/dL — AB (ref 8.9–10.3)
CO2: 29 mmol/L (ref 22–32)
CREATININE: 0.68 mg/dL (ref 0.61–1.24)
Chloride: 99 mmol/L — ABNORMAL LOW (ref 101–111)
GFR calc non Af Amer: >60 mL/min (ref >60)
Glucose, Bld: 197 mg/dL — ABNORMAL HIGH (ref 65–99)
Potassium: 4 mmol/L (ref 3.5–5.1)
SODIUM: 138 mmol/L (ref 135–145)

## 2017-06-01 LAB — GLUCOSE, CAPILLARY
GLUCOSE-CAPILLARY: 180 mg/dL — AB (ref 65–99)
GLUCOSE-CAPILLARY: 165 mg/dL — AB (ref 65–99)
Glucose-Capillary: 154 mg/dL — ABNORMAL HIGH (ref 65–99)
Glucose-Capillary: 191 mg/dL — ABNORMAL HIGH (ref 65–99)
Glucose-Capillary: 202 mg/dL — ABNORMAL HIGH (ref 65–99)
Glucose-Capillary: 246 mg/dL — ABNORMAL HIGH (ref 65–99)

## 2017-06-01 MED ORDER — NICOTINE 14 MG/24HR TD PT24
14.0000 mg | MEDICATED_PATCH | Freq: Every day | TRANSDERMAL | Status: DC
  Administered 2017-06-01 – 2017-06-03 (×3): 14 mg via TRANSDERMAL
  Filled 2017-06-01 (×3): qty 1

## 2017-06-01 MED ORDER — ENOXAPARIN SODIUM 60 MG/0.6ML ~~LOC~~ SOLN
60.0000 mg | SUBCUTANEOUS | Status: DC
  Filled 2017-06-01: qty 0.6

## 2017-06-01 MED ORDER — INSULIN GLARGINE 100 UNIT/ML ~~LOC~~ SOLN
25.0000 [IU] | Freq: Every day | SUBCUTANEOUS | Status: DC
  Administered 2017-06-01 – 2017-06-02 (×2): 25 [IU] via SUBCUTANEOUS
  Filled 2017-06-01 (×2): qty 0.25

## 2017-06-01 MED ORDER — TRAMADOL HCL 50 MG PO TABS
100.0000 mg | ORAL_TABLET | Freq: Once | ORAL | Status: AC
  Administered 2017-06-01: 100 mg via ORAL
  Filled 2017-06-01: qty 2

## 2017-06-01 MED ORDER — ENOXAPARIN SODIUM 40 MG/0.4ML ~~LOC~~ SOLN
40.0000 mg | SUBCUTANEOUS | Status: DC
  Administered 2017-06-01: 40 mg via SUBCUTANEOUS
  Filled 2017-06-01: qty 0.4

## 2017-06-01 MED ORDER — VANCOMYCIN HCL 10 G IV SOLR
1500.0000 mg | Freq: Three times a day (TID) | INTRAVENOUS | Status: DC
  Administered 2017-06-01 – 2017-06-02 (×4): 1500 mg via INTRAVENOUS
  Filled 2017-06-01 (×5): qty 1500

## 2017-06-01 NOTE — Progress Notes (Signed)
PROGRESS NOTE    RAYDEN DOCK  ZHY:[id number] DOB: 02/04/1971 DOA: 05/30/2017 PCP: Patient, No Pcp Per     Brief Narrative:  Andee Poles a 47 y.o.malewithhistory of tobacco abusewho has not been to a physician for many years presents to the ER with complaints of worsening pain and swelling of the scrotal area. Patient states that he noticed a small knot-like area in the left groin area for last 1 week. Day prior to admission, it became suddenly worse and involving the whole scrotum mostly on the left side with erythema and pain. In the ER patient had CT pelvis done which shows features concerning for developing phlegmon. Urology was consulted and was recommended for IV antibiotics and blood sugar control.   Assessment & Plan:   Principal Problem:   Cellulitis of scrotum Active Problems:   Hyperglycemia   Cellulitis   Cellulitis, scrotum  Scrotal cellulitis with fluid collection -US scrotum revealed prominent diffused subcutaneous edema throughout scrotum L>R -CT pelvis revealed moderate to large scrotal fluid collections, asymmetric edema and soft tissue thickening in upper left scrotum, suspicious for phlegmon, no well defined abscess  -Gonorrhea/Chlamydia negative, HIV negative. Blood cultures NGTD  -Urology consulted, Dr. Berneice Heinrich. No surgical intervention for now. Monitor.  -Continue IV vanco/zosyn -On exam, appears that erythema is mildly improved, and scrotum is less taut, although very edematous still. WBC 20.6 --> 12.5   Acute hypercarbic respiratory failure due to OSA/OHS -Patient admits to snoring at night, wakes up every 45-60 minutes when sleeping, does not wake up refreshed, daytime fatigue. Definitely needs sleep study as outpatient -Continue BiPAP. Patient has been on and off BiPAP during last night. Discussed importance of BiPAP esp when he sleeps or naps  -Repeat ABG tomorrow  Uncontrolled new dx DM  -Ha1c 13.5 -DM coordinator consulted -Continue insulin     Hyponatremia -Resolved with IVF   Tobacco abuse -Nicotine patch. Cessation counseling.    DVT prophylaxis: lovenox Code Status: full Family Communication: no family at bedside Disposition Plan: pending improvement   Consultants:   Urology  Procedures:   None   Antimicrobials:  Anti-infectives (From admission, onward)   Start     Dose/Rate Route Frequency Ordered Stop   06/01/17 0900  vancomycin (VANCOCIN) 1,500 mg in sodium chloride 0.9 % 500 mL IVPB     1,500 mg 250 mL/hr over 120 Minutes Intravenous Every 8 hours 06/01/17 0709     05/31/17 0600  vancomycin (VANCOCIN) 1,250 mg in sodium chloride 0.9 % 250 mL IVPB  Status:  Discontinued     1,250 mg 166.7 mL/hr over 90 Minutes Intravenous Every 8 hours 05/30/17 2110 06/01/17 0709   05/31/17 0200  piperacillin-tazobactam (ZOSYN) IVPB 3.375 g     3.375 g 12.5 mL/hr over 240 Minutes Intravenous Every 8 hours 05/31/17 0128     05/31/17 0015  ceFAZolin (ANCEF) IVPB 1 g/50 mL premix     1 g 100 mL/hr over 30 Minutes Intravenous  Once 05/31/17 0014 05/31/17 0151   05/30/17 2345  piperacillin-tazobactam (ZOSYN) IVPB 3.375 g  Status:  Discontinued     3.375 g 100 mL/hr over 30 Minutes Intravenous  Once 05/30/17 2344 05/31/17 0013   05/30/17 2200  vancomycin (VANCOCIN) 2,000 mg in sodium chloride 0.9 % 500 mL IVPB     2,000 mg 250 mL/hr over 120 Minutes Intravenous  Once 05/30/17 2110 05/31/17 0009       Subjective: Pain in his scrotum and swelling feels about the same. Wants to  get up and walk.   Objective: Vitals:   06/01/17 0414 06/01/17 0442 06/01/17 0810 06/01/17 1210  BP: 135/81  130/84 139/87  Pulse: 71 85 84 81  Resp: 16 18 16 19   Temp: (!) 97.5 F (36.4 C)  98.4 F (36.9 C) 99.8 F (37.7 C)  TempSrc: Oral  Oral Axillary  SpO2: 95% 96% 90% 91%  Weight:      Height:        Intake/Output Summary (Last 24 hours) at 06/01/2017 1324 Last data filed at 06/01/2017 0865 Gross per 24 hour  Intake 1732.5 ml   Output 975 ml  Net 757.5 ml   Filed Weights   05/30/17 1941 05/31/17 1206  Weight: 124.7 kg (275 lb) 125.8 kg (277 lb 4.8 oz)    Examination:  General exam: Appears calm and comfortable, less lethargic than yesterday  Respiratory system: Clear to auscultation. Respiratory effort normal. Cardiovascular system: S1 & S2 heard, RRR. No JVD, murmurs, rubs, gallops or clicks. No pedal edema. Gastrointestinal system: Abdomen is nondistended, soft and nontender. No organomegaly or masses felt. Normal bowel sounds heard. Central nervous system: Alert and oriented. No focal neurological deficits. Extremities: Symmetric 5 x 5 power. Skin: No rashes, lesions or ulcers Psychiatry: Judgement and insight appear normal. Mood & affect appropriate.   Data Reviewed: I have personally reviewed following labs and imaging studies  CBC: Recent Labs  Lab 05/30/17 1957 05/31/17 0533 06/01/17 0511  WBC 20.6* 16.2* 12.5*  NEUTROABS 16.3*  --   --   HGB 17.2* 15.9 15.5  HCT 49.4 46.9 47.7  MCV 86.1 87.3 90.3  PLT 292 260 266   Basic Metabolic Panel: Recent Labs  Lab 05/30/17 1957 05/31/17 0533 06/01/17 0511  NA 129* 131* 138  K 3.6 3.8 4.0  CL 92* 95* 99*  CO2 24 24 29   GLUCOSE 337* 328* 197*  BUN 10 10 8   CREATININE 0.85 0.78 0.68  CALCIUM 8.9 8.4* 8.5*   GFR: Estimated Creatinine Clearance: 153.6 mL/min (by C-G formula based on SCr of 0.68 mg/dL). Liver Function Tests: No results for input(s): AST, ALT, ALKPHOS, BILITOT, PROT, ALBUMIN in the last 168 hours. No results for input(s): LIPASE, AMYLASE in the last 168 hours. No results for input(s): AMMONIA in the last 168 hours. Coagulation Profile: No results for input(s): INR, PROTIME in the last 168 hours. Cardiac Enzymes: Recent Labs  Lab 05/31/17 0533  TROPONINI <0.03   BNP (last 3 results) No results for input(s): PROBNP in the last 8760 hours. HbA1C: Recent Labs    05/30/17 1957  HGBA1C 13.5*   CBG: Recent Labs  Lab  05/31/17 2011 06/01/17 0014 06/01/17 0409 06/01/17 0809 06/01/17 1209  GLUCAP 259* 154* 202* 180* 165*   Lipid Profile: No results for input(s): CHOL, HDL, LDLCALC, TRIG, CHOLHDL, LDLDIRECT in the last 72 hours. Thyroid Function Tests: No results for input(s): TSH, T4TOTAL, FREET4, T3FREE, THYROIDAB in the last 72 hours. Anemia Panel: No results for input(s): VITAMINB12, FOLATE, FERRITIN, TIBC, IRON, RETICCTPCT in the last 72 hours. Sepsis Labs: Recent Labs  Lab 05/30/17 2140 05/30/17 2229  LATICACIDVEN 1.20 1.26    Recent Results (from the past 240 hour(s))  Blood culture (routine x 2)     Status: None (Preliminary result)   Collection Time: 05/30/17  9:52 PM  Result Value Ref Range Status   Specimen Description BLOOD RIGHT ANTECUBITAL  Final   Special Requests IN PEDIATRIC BOTTLE Blood Culture adequate volume  Final   Culture NO GROWTH  2 DAYS  Final   Report Status PENDING  Incomplete  Blood culture (routine x 2)     Status: None (Preliminary result)   Collection Time: 05/30/17  9:58 PM  Result Value Ref Range Status   Specimen Description BLOOD RIGHT ARM  Final   Special Requests   Final    BOTTLES DRAWN AEROBIC AND ANAEROBIC Blood Culture adequate volume   Culture NO GROWTH 2 DAYS  Final   Report Status PENDING  Incomplete  MRSA PCR Screening     Status: None   Collection Time: 05/31/17  1:18 AM  Result Value Ref Range Status   MRSA by PCR NEGATIVE NEGATIVE Final    Comment:        The GeneXpert MRSA Assay (FDA approved for NASAL specimens only), is one component of a comprehensive MRSA colonization surveillance program. It is not intended to diagnose MRSA infection nor to guide or monitor treatment for MRSA infections.        Radiology Studies: Ct Pelvis W Contrast  Result Date: 05/30/2017 CLINICAL DATA:  Testicular pain and swelling with abscess to the right groin EXAM: CT PELVIS WITH CONTRAST TECHNIQUE: Multidetector CT imaging of the pelvis was  performed using the standard protocol following the bolus administration of intravenous contrast. CONTRAST:  100mL ISOVUE-300 IOPAMIDOL (ISOVUE-300) INJECTION 61% COMPARISON:  CT abdomen pelvis 01/27/2016 FINDINGS: Urinary Tract:  No abnormality visualized. Bowel:  Unremarkable visualized pelvic bowel loops. Vascular/Lymphatic: Mild aortic atherosclerosis. No aneurysmal dilatation. Small external iliac nodes, measuring 8 mm on the right and 7 mm on the left. Reproductive: Large scrotal fluid collections. Fatty inguinal hernias. Asymmetric edema and skin thickening to the left scrotum with focal nodular soft tissue thickening present, series 3, image number 64. No well-defined rim enhancing fluid collection. No soft tissue gas. Prostate unremarkable Other: Moderate fatty umbilical and periumbilical hernia. Negative for free fluid in the pelvis Musculoskeletal: No suspicious bone lesions identified. IMPRESSION: 1. Moderate to large scrotal fluid collections. Asymmetric edema and soft tissue thickening in the upper left scrotum, suspicious for a phlegmon; no well-defined rim enhancing soft tissue abscess noted by CT 2. Mild external iliac adenopathy likely reactive Electronically Signed   By: Jasmine PangKim  Fujinaga M.D.   On: 05/30/2017 23:39   [place] Scrotum  Result Date: 05/31/2017 CLINICAL DATA:  Scrotal swelling for 1 week. EXAM: SCROTAL ULTRASOUND DOPPLER ULTRASOUND OF THE TESTICLES TECHNIQUE: Complete ultrasound examination of the testicles, epididymis, and other scrotal structures was performed. Color and spectral Doppler ultrasound were also utilized to evaluate blood flow to the testicles. COMPARISON:  05/30/2017 CT pelvis. FINDINGS: Right testicle Measurements: 4.6 x 2.5 x 3.1 cm. No mass or microlithiasis visualized. Left testicle Measurements: 4.2 x 2.9 x 3.1 cm. No mass or microlithiasis visualized. Right epididymis: Simple 6 x 5 x 4 mm cyst versus spermatocele in the right epididymal head. Otherwise normal right  epididymis. Left epididymis:  Normal in size and appearance. Hydrocele:  No significant hydrocele. Varicocele:  Mild left varicocele.  No right varicocele. Prominent diffuse subcutaneous edema throughout the scrotum, left greater than right. Pulsed Doppler interrogation of both testes demonstrates normal low resistance arterial and venous waveforms bilaterally. IMPRESSION: 1. No evidence of testicular torsion. Normal testes with no testicular mass. 2. Prominent diffuse subcutaneous edema throughout the scrotum, left greater than right. No significant hydroceles. 3. Mild left varicocele. 4. Simple subcentimeter cyst versus spermatocele in the right epididymal head. Otherwise normal epididymides. Electronically Signed   By: Delbert PhenixJason A Poff M.D.   On:  05/31/2017 01:05   [place] Art/ven Flow Abd Pelv Doppler  Result Date: 05/31/2017 CLINICAL DATA:  Scrotal swelling for 1 week. EXAM: SCROTAL ULTRASOUND DOPPLER ULTRASOUND OF THE TESTICLES TECHNIQUE: Complete ultrasound examination of the testicles, epididymis, and other scrotal structures was performed. Color and spectral Doppler ultrasound were also utilized to evaluate blood flow to the testicles. COMPARISON:  05/30/2017 CT pelvis. FINDINGS: Right testicle Measurements: 4.6 x 2.5 x 3.1 cm. No mass or microlithiasis visualized. Left testicle Measurements: 4.2 x 2.9 x 3.1 cm. No mass or microlithiasis visualized. Right epididymis: Simple 6 x 5 x 4 mm cyst versus spermatocele in the right epididymal head. Otherwise normal right epididymis. Left epididymis:  Normal in size and appearance. Hydrocele:  No significant hydrocele. Varicocele:  Mild left varicocele.  No right varicocele. Prominent diffuse subcutaneous edema throughout the scrotum, left greater than right. Pulsed Doppler interrogation of both testes demonstrates normal low resistance arterial and venous waveforms bilaterally. IMPRESSION: 1. No evidence of testicular torsion. Normal testes with no testicular mass. 2.  Prominent diffuse subcutaneous edema throughout the scrotum, left greater than right. No significant hydroceles. 3. Mild left varicocele. 4. Simple subcentimeter cyst versus spermatocele in the right epididymal head. Otherwise normal epididymides. Electronically Signed   By: Delbert Phenix M.D.   On: 05/31/2017 01:05   Dg Chest Port 1 View  Result Date: 05/31/2017 CLINICAL DATA:  47 year old male with hypoxia. EXAM: PORTABLE CHEST 1 VIEW COMPARISON:  Chest CT dated 03/21/2016 FINDINGS: Mild diffuse interstitial coarsening and chronic bronchitic changes. No focal consolidation, pleural effusion, or pneumothorax. The cardiac silhouette is within normal limits. Mild widening of the right AC joint, chronic. No acute osseous pathology. IMPRESSION: No active disease. Electronically Signed   By: Elgie Collard M.D.   On: 05/31/2017 06:01      Scheduled Meds: . enoxaparin (LOVENOX) injection  40 mg Subcutaneous Q24H  . Influenza vac split quadrivalent PF  0.5 mL Intramuscular Tomorrow-1000  . insulin aspart  0-15 Units Subcutaneous Q4H  . insulin glargine  25 Units Subcutaneous QHS  . nicotine  14 mg Transdermal Daily  . pneumococcal 23 valent vaccine  0.5 mL Intramuscular Tomorrow-1000   Continuous Infusions: . piperacillin-tazobactam 3.375 g (06/01/17 0918)  . vancomycin Stopped (06/01/17 1121)     LOS: 1 day    Time spent: 30 minutes   Noralee Stain, DO [facility] [email or web address] Password [email or web address] 06/01/2017, 1:24 PM

## 2017-06-01 NOTE — Progress Notes (Signed)
Patient stated that he could no longer tolerate Bipap and took it off.  Placed patient back on 3L of O2 and saturation levels up in the 90s.

## 2017-06-01 NOTE — Care Management Note (Signed)
Case Management Note  Patient Details  Name: Shawn DunningsDavid E Patrick MRN: [id number] Date of Birth: 12/26/1970  Subjective/Objective:  Pt presented for worsening pain and swelling of the scrotal area-celulitis. Initiated on IV antibiotics. Acute Respiratory Failure- Pt continues on the BIPAP. Pt works in [employer] insurance and is without a PCP. PTA from home with an elderly couple where he rents a room from them. Pt states he has transportation that he can get to appointments.                  Action/Plan: [facility] and [facility] on [facility]. Diabetes Coordinator is following up with patient for new onset Diabetes. Pt will be able to utilize the Pharmacy onsite at clinic and medications will range from $4.00-$10.00. CM will continue to follow.   Expected Discharge Date:                  Expected Discharge Plan:  Home/Self Care  In-House Referral:  Financial Counselor  Discharge planning Services  CM Consult, Medication Assistance, Follow-up appt scheduled, [facility]  Post Acute Care Choice:  NA Choice offered to:  NA  DME Arranged:  N/A DME Agency:  NA  HH Arranged:  NA HH Agency:  NA  Status of Service:  Completed, signed off  If discussed at Long Length of Stay Meetings, dates discussed:    Additional Comments:  Gala LewandowskyGraves-Bigelow, Dymond Gutt Kaye, RN 06/01/2017, 3:34 PM

## 2017-06-01 NOTE — Progress Notes (Signed)
RT placed patient on BIPAP HS. Patient tolerating well at this time.  

## 2017-06-01 NOTE — Progress Notes (Signed)
Pharmacy Antibiotic Note  Shawn Patrick is a 47 y.o. male admitted on 05/30/2017 with cellulitis/abscess on right groin.  Pharmacy has been consulted for vancomycin dosing. WBC down to 12.5  Vancomycin trough: 12 on 1250mg  IV q8 hours  Plan: Vancomycin 1500 mg IV every 8 hours.  Goal trough 10-15.  Monitor for clinical progression, renal function, and trough as needed.  Height: 5\' 10"  (177.8 cm) Weight: 277 lb 4.8 oz (125.8 kg) IBW/kg (Calculated) : 73  Temp (24hrs), Avg:98.2 F (36.8 C), Min:97.5 F (36.4 C), Max:98.6 F (37 C)  Recent Labs  Lab 05/30/17 1957 05/30/17 2140 05/30/17 2229 05/31/17 0533 06/01/17 0511  WBC 20.6*  --   --  16.2* 12.5*  CREATININE 0.85  --   --  0.78 0.68  LATICACIDVEN  --  1.20 1.26  --   --   VANCOTROUGH  --   --   --   --  12*    Estimated Creatinine Clearance: 153.6 mL/min (by C-G formula based on SCr of 0.68 mg/dL).    No Known Allergies  Antimicrobials this admission Vancomycin 1/15 >>   Dose adjustments this admission: N/A  Microbiology results: None ordered at this time.  Follow up with any cultures.  Thank you for allowing pharmacy to be a part of this patient's care.  Smitty Cordsony L Larcenia Holaday, PharmD 06/01/2017 7:01 AM

## 2017-06-01 NOTE — Progress Notes (Signed)
Spoke with patient about the new onset of diabetes. Patient states that he has been having frequent urination and thirst for about 2 years. Blood work has never shown that he has diabetes. States that his grandmother had diabetes. He lives with a disabled family and works as a Administratorlandscaper. States that he has been drinking regular sodas and sweet tea for the last year or so. States that he quit drinking beer last summer "cold [place]" and has not started back. Patient states that he  needs to have teeth removed due to poor dental hygiene.  Showed him the insulin pen and how it works. Staff RN has let the patient give his own insulin injection today X2. Reviewed the results of HgbA1C test of 13.5%. Given information in regards to blood sugar levels over the 2-3 month period and what normal blood sugar should be. He states that he does have tingling in his feet. Reviewed importance of foot care.   Will consult a dietician to talk with patient for meal planning ideas. Will need to find a PCP to follow up with, does not have health insurance. Case manager to follow up with patient in regards to PCP follow up at discharge and where to get medications.   Will continue to monitor blood sugars while in the hospital.   Smith MinceKendra Vishaal Strollo RN BSN CDE Diabetes Coordinator Pager: [phone]  8am-5pm

## 2017-06-01 NOTE — Progress Notes (Signed)
RT placed patient back on BIPAP due to Abg results. Patient tolerating at this time

## 2017-06-02 LAB — BLOOD GAS, ARTERIAL
Acid-Base Excess: 7.6 mmol/L — ABNORMAL HIGH (ref 0.0–2.0)
Bicarbonate: 33.2 mmol/L — ABNORMAL HIGH (ref 20.0–28.0)
DELIVERY SYSTEMS: POSITIVE
Drawn by: 521601
Expiratory PAP: 6
FIO2: 40
INSPIRATORY PAP: 12
Mode: POSITIVE
O2 Saturation: 94.4 %
PATIENT TEMPERATURE: 98.6
PO2 ART: 75 mmHg — AB (ref 83.0–108.0)
pCO2 arterial: 62.1 mmHg — ABNORMAL HIGH (ref 32.0–48.0)
pH, Arterial: 7.347 — ABNORMAL LOW (ref 7.350–7.450)

## 2017-06-02 LAB — BASIC METABOLIC PANEL
Anion gap: 10 (ref 5–15)
BUN: 9 mg/dL (ref 6–20)
CALCIUM: 8.5 mg/dL — AB (ref 8.9–10.3)
CO2: 29 mmol/L (ref 22–32)
CREATININE: 0.76 mg/dL (ref 0.61–1.24)
Chloride: 100 mmol/L — ABNORMAL LOW (ref 101–111)
Glucose, Bld: 169 mg/dL — ABNORMAL HIGH (ref 65–99)
Potassium: 4 mmol/L (ref 3.5–5.1)
SODIUM: 139 mmol/L (ref 135–145)

## 2017-06-02 LAB — CBC
HCT: 46.1 % (ref 39.0–52.0)
Hemoglobin: 14.8 g/dL (ref 13.0–17.0)
MCH: 28.9 pg (ref 26.0–34.0)
MCHC: 32.1 g/dL (ref 30.0–36.0)
MCV: 90 fL (ref 78.0–100.0)
PLATELETS: 284 10*3/uL (ref 150–400)
RBC: 5.12 MIL/uL (ref 4.22–5.81)
RDW: 13.1 % (ref 11.5–15.5)
WBC: 12.3 10*3/uL — AB (ref 4.0–10.5)

## 2017-06-02 LAB — GLUCOSE, CAPILLARY
GLUCOSE-CAPILLARY: 142 mg/dL — AB (ref 65–99)
GLUCOSE-CAPILLARY: 155 mg/dL — AB (ref 65–99)
Glucose-Capillary: 133 mg/dL — ABNORMAL HIGH (ref 65–99)
Glucose-Capillary: 137 mg/dL — ABNORMAL HIGH (ref 65–99)
Glucose-Capillary: 144 mg/dL — ABNORMAL HIGH (ref 65–99)
Glucose-Capillary: 145 mg/dL — ABNORMAL HIGH (ref 65–99)
Glucose-Capillary: 153 mg/dL — ABNORMAL HIGH (ref 65–99)

## 2017-06-02 MED ORDER — SULFAMETHOXAZOLE-TRIMETHOPRIM 800-160 MG PO TABS
2.0000 | ORAL_TABLET | Freq: Two times a day (BID) | ORAL | Status: DC
  Administered 2017-06-02 – 2017-06-03 (×3): 2 via ORAL
  Filled 2017-06-02 (×4): qty 2

## 2017-06-02 MED ORDER — SODIUM CHLORIDE 0.9 % IV SOLN: 250.0000 mL | INTRAVENOUS | Status: DC | PRN

## 2017-06-02 MED ORDER — SODIUM CHLORIDE 0.9% FLUSH: 3.0000 mL | INTRAVENOUS | Status: DC | PRN

## 2017-06-02 MED ORDER — SODIUM CHLORIDE 0.9% FLUSH
3.0000 mL | Freq: Two times a day (BID) | INTRAVENOUS | Status: DC
  Administered 2017-06-02: 3 mL via INTRAVENOUS

## 2017-06-02 NOTE — Plan of Care (Signed)
Pt ambulates with steady gait with standby assistance. Verbalizes minimal difficulty due to scrotum swelling. Instructed to call for assistance prior to ambulation.

## 2017-06-02 NOTE — Plan of Care (Signed)
  Progressing Pain Managment: General experience of comfort will improve 06/02/2017 0434 - Progressing by Horris Latinouvall, Kyung Muto G, RN Note Denies c/o pain or discomfort. Safety: Ability to remain free from injury will improve 06/02/2017 0434 - Progressing by Horris Latinouvall, Marquia Costello G, RN

## 2017-06-02 NOTE — Progress Notes (Signed)
Nutrition Education Note   RD consulted for nutrition education regarding new diagnosis of T2DM diabetes.  Spoke with pt who reports he is motivated to make dietary changes to manage newly diagnosed T2DM.  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 honeybun, soda, crackers) and skips lunch. Drinks sugar sodas and sweetened tea all day (~8-10 16oz containers, e.g. Mt. Dew). Typical dinner is from freezer section (e.g. pizza) or occasionally prepares meats (e.g. pork loin/chicken). Pt shared that he typically eats a lot of sweet snacks in the evening such as cookies.  Education session was focused on Carb Counting for people with T2DM.    RD provided "Carbohydrate Counting for People with Diabetes" handout from the Academy of Nutrition and Dietetics. Reviewed patient's dietary recall. Provided examples on ways to decrease intake of simple/processed carbohydrates in diet. Discussed different food groups and their effects on blood sugar. Provided list of carbohydrates and discussed importance of controlled and consistent carbohydrate intake throughout the day. Provided examples of ways to balance meals/snacks and encouraged moderation of carbohydrates intake, consumption of simple vs complex carbohydrates, and pairing carbohydrates with protein/fat. Encouraged fresh fruits, vegetables, and legumes to maximize fiber intake.    Teach back method used.  Expect fair compliance.   Body mass index is 40.58 kg/m. Pt meets criteria for obesity, class III based on current BMI. Current diet order is Heart Healthy/ Carb Modified, patient is consuming approximately 100% of meals at this time. Labs and medications reviewed. No further nutrition interventions warranted at this time. RD contact information provided.  Recommend follow-up outpatient diabetes nutrition education at [facility].   If  additional nutrition issues arise, please re-consult RD.  Marjie Skiffherie N. Shell Yandow, MS Dietetic Intern Pager: [phone]

## 2017-06-02 NOTE — Progress Notes (Signed)
Subjective:  1- Scrotal Cellulitis / Abscess - pt admitted AM 1/16 for scrotal cellulitis / new diabetes, placed on empiric vanc + zosyn (no fluid collections / nec fasc on imagign) . On 1/18 began having more organized left sided abscess and bedside I+D performed, WCX obtained / pending.  Today "Shawn Patrick" reports some scant drainage and more pain left hemiscrotum, he appears to now have organized abscess.    Objective: Vital signs in last 24 hours: Temp:  [98 F (36.7 C)-98.7 F (37.1 C)] 98.3 F (36.8 C) (01/18 1135) Pulse Rate:  [70-87] 75 (01/18 1135) Resp:  [14-18] 16 (01/18 1135) BP: (137-150)/(75-116) 137/116 (01/18 1135) SpO2:  [92 %-98 %] 95 % (01/18 1135) Weight:  [128.3 kg (282 lb 12.8 oz)] 128.3 kg (282 lb 12.8 oz) (01/18 0400)  Intake/Output from previous day: 01/17 0701 - 01/18 0700 In: 2610 [P.O.:960; IV Piggyback:1650] Out: 1500 [Urine:1500] Intake/Output this shift: No intake/output data recorded.  Physical Exam:  General:alert and appears stated age GI: stable obesity Male genitalia: improved overall but persistant scrotal edema / erythema but new left sided 4x4 area of flucteunce and puncate drainge c/w abscess. No crepitus.  non-labored breathing,   Lab Results: Recent Labs    05/31/17 0533 06/01/17 0511 06/02/17 0443  HGB 15.9 15.5 14.8  HCT 46.9 47.7 46.1   BMET Recent Labs    06/01/17 0511 06/02/17 0443  NA 138 139  K 4.0 4.0  CL 99* 100*  CO2 29 29  GLUCOSE 197* 169*  BUN 8 9  CREATININE 0.68 0.76  CALCIUM 8.5* 8.5*   No results for input(s): LABPT, INR in the last 72 hours. No results for input(s): LABURIN in the last 72 hours. Results for orders placed or performed during the hospital encounter of 05/30/17  Blood culture (routine x 2)     Status: None (Preliminary result)   Collection Time: 05/30/17  9:52 PM  Result Value Ref Range Status   Specimen Description BLOOD RIGHT ANTECUBITAL  Final   Special Requests IN PEDIATRIC BOTTLE  Blood Culture adequate volume  Final   Culture NO GROWTH 3 DAYS  Final   Report Status PENDING  Incomplete  Blood culture (routine x 2)     Status: None (Preliminary result)   Collection Time: 05/30/17  9:58 PM  Result Value Ref Range Status   Specimen Description BLOOD RIGHT ARM  Final   Special Requests   Final    BOTTLES DRAWN AEROBIC AND ANAEROBIC Blood Culture adequate volume   Culture NO GROWTH 3 DAYS  Final   Report Status PENDING  Incomplete  MRSA PCR Screening     Status: None   Collection Time: 05/31/17  1:18 AM  Result Value Ref Range Status   MRSA by PCR NEGATIVE NEGATIVE Final    Comment:        The GeneXpert MRSA Assay (FDA approved for NASAL specimens only), is one component of a comprehensive MRSA colonization surveillance program. It is not intended to diagnose MRSA infection nor to guide or monitor treatment for MRSA infections.    INCISION AND DRAINAGE:  Using aseptic technique left scrotum prepped with iodine and 10cc 2% with epi infiltrated. After 5 mins incision 2cm at area of maxiumum fluctuence and approx 20mL purlulent flulid drained. Cavity probed with surgeon's finger to ensure drainage. Packed with dry rolled gauze dressing.  Studies/Results: No results found.  Assessment/Plan:   1- Scrotal Cellulitis / Abscess - systemic symptoms improving, but now with more organized abscess,  this was drained completely at bedside today. Plan for daily dressing changes to continue at home and healing by secondary intent (will take few weeks) discussed. He is amenable to learning how to perform this. Will request wound consult to help with education in house.    LOS: 2 days   [facility], Blakelee Allington 06/02/2017, 12:33 PM

## 2017-06-02 NOTE — Progress Notes (Addendum)
PROGRESS NOTE    Shawn Patrick  UEA:[id number] DOB: 1970-08-13 DOA: 05/30/2017 PCP: Patient, No Pcp Per     Brief Narrative:  Shawn Patrick a 47 y.o.malewithhistory of tobacco abusewho has not been to a physician for many years presents to the ER with complaints of worsening pain and swelling of the scrotal area. Patient states that he noticed a small knot-like area in the left groin area for last 1 week. Day prior to admission, it became suddenly worse and involving the whole scrotum mostly on the left side with erythema and pain. In the ER patient had CT pelvis done which shows features concerning for developing phlegmon. Urology was consulted and was recommended for IV antibiotics and blood sugar control.   Assessment & Plan:   Principal Problem:   Cellulitis of scrotum Active Problems:   Hyperglycemia   Cellulitis   Cellulitis, scrotum  Scrotal cellulitis with fluid collection -US scrotum revealed prominent diffused subcutaneous edema throughout scrotum L>R -CT pelvis revealed moderate to large scrotal fluid collections, asymmetric edema and soft tissue thickening in upper left scrotum, suspicious for phlegmon, no well defined abscess  -Gonorrhea/Chlamydia negative, HIV negative. Blood cultures NGTD  -Urology consulted, Dr. Berneice Heinrich. No surgical intervention for now. Monitor.  -On exam, appears that erythema is mildly improved, and scrotum is less edematous. He has serosanguinous drainage of left upper scrotum/groin. WBC 20.6 --> 12.3 -Spoke with Dr. Berneice Heinrich today; he will reevaluate patient this afternoon, s/p I&D. -Deescalate antibiotics to cover purulent cellulitis, will start bactrim to cover MRSA   Acute hypercarbic respiratory failure due to OSA/OHS -Patient admits to snoring at night, wakes up every 45-60 minutes when sleeping, does not wake up refreshed, daytime fatigue. Definitely needs sleep study as outpatient -ABG improved although remains hypercarbic -Continue  BiPAP qhs   Uncontrolled new dx DM  -Ha1c 13.5 -DM coordinator consulted -Continue insulin. Blood sugar improved   Tobacco abuse -Nicotine patch. Cessation counseling.    DVT prophylaxis: lovenox Code Status: full Family Communication: no family at bedside Disposition Plan: pending improvement   Consultants:   Urology  Procedures:   None   Antimicrobials:  Anti-infectives (From admission, onward)   Start     Dose/Rate Route Frequency Ordered Stop   06/01/17 0900  vancomycin (VANCOCIN) 1,500 mg in sodium chloride 0.9 % 500 mL IVPB     1,500 mg 250 mL/hr over 120 Minutes Intravenous Every 8 hours 06/01/17 0709     05/31/17 0600  vancomycin (VANCOCIN) 1,250 mg in sodium chloride 0.9 % 250 mL IVPB  Status:  Discontinued     1,250 mg 166.7 mL/hr over 90 Minutes Intravenous Every 8 hours 05/30/17 2110 06/01/17 0709   05/31/17 0200  piperacillin-tazobactam (ZOSYN) IVPB 3.375 g     3.375 g 12.5 mL/hr over 240 Minutes Intravenous Every 8 hours 05/31/17 0128     05/31/17 0015  ceFAZolin (ANCEF) IVPB 1 g/50 mL premix     1 g 100 mL/hr over 30 Minutes Intravenous  Once 05/31/17 0014 05/31/17 0151   05/30/17 2345  piperacillin-tazobactam (ZOSYN) IVPB 3.375 g  Status:  Discontinued     3.375 g 100 mL/hr over 30 Minutes Intravenous  Once 05/30/17 2344 05/31/17 0013   05/30/17 2200  vancomycin (VANCOCIN) 2,000 mg in sodium chloride 0.9 % 500 mL IVPB     2,000 mg 250 mL/hr over 120 Minutes Intravenous  Once 05/30/17 2110 05/31/17 0009       Subjective: Continues to complain of pain in his  scrotum.    Objective: Vitals:   06/01/17 2250 06/02/17 0055 06/02/17 0400 06/02/17 0755  BP:  (!) 148/97 (!) 150/95   Pulse: 70 73 85   Resp: 18 14 14    Temp:  98 F (36.7 C) 98.3 F (36.8 C)   TempSrc:  Axillary Axillary   SpO2: 97% 98% 92% 95%  Weight:   128.3 kg (282 lb 12.8 oz)   Height:        Intake/Output Summary (Last 24 hours) at 06/02/2017 1027 Last data filed at  06/02/2017 0416 Gross per 24 hour  Intake 2250 ml  Output 1100 ml  Net 1150 ml   Filed Weights   05/30/17 1941 05/31/17 1206 06/02/17 0400  Weight: 124.7 kg (275 lb) 125.8 kg (277 lb 4.8 oz) 128.3 kg (282 lb 12.8 oz)    Examination:  General exam: Appears calm and comfortable Respiratory system: Clear to auscultation. Respiratory effort normal. Off Bipap this morning  Cardiovascular system: S1 & S2 heard, RRR. No JVD, murmurs, rubs, gallops or clicks. No pedal edema. Gastrointestinal system: Abdomen is nondistended, soft and nontender. No organomegaly or masses felt. Normal bowel sounds heard. Central nervous system: Alert and oriented. No focal neurological deficits. Extremities: Symmetric 5 x 5 power. GU: Scrotum is generally less edematous, still remains erythematous, +small amount of serosanguinous drainage from left upper groin  Psychiatry: Judgement and insight appear normal. Mood & affect appropriate.   Data Reviewed: I have personally reviewed following labs and imaging studies  CBC: Recent Labs  Lab 05/30/17 1957 05/31/17 0533 06/01/17 0511 06/02/17 0443  WBC 20.6* 16.2* 12.5* 12.3*  NEUTROABS 16.3*  --   --   --   HGB 17.2* 15.9 15.5 14.8  HCT 49.4 46.9 47.7 46.1  MCV 86.1 87.3 90.3 90.0  PLT 292 260 266 284   Basic Metabolic Panel: Recent Labs  Lab 05/30/17 1957 05/31/17 0533 06/01/17 0511 06/02/17 0443  NA 129* 131* 138 139  K 3.6 3.8 4.0 4.0  CL 92* 95* 99* 100*  CO2 24 24 29 29   GLUCOSE 337* 328* 197* 169*  BUN 10 10 8 9   CREATININE 0.85 0.78 0.68 0.76  CALCIUM 8.9 8.4* 8.5* 8.5*   GFR: Estimated Creatinine Clearance: 155.2 mL/min (by C-G formula based on SCr of 0.76 mg/dL). Liver Function Tests: No results for input(s): AST, ALT, ALKPHOS, BILITOT, PROT, ALBUMIN in the last 168 hours. No results for input(s): LIPASE, AMYLASE in the last 168 hours. No results for input(s): AMMONIA in the last 168 hours. Coagulation Profile: No results for  input(s): INR, PROTIME in the last 168 hours. Cardiac Enzymes: Recent Labs  Lab 05/31/17 0533  TROPONINI <0.03   BNP (last 3 results) No results for input(s): PROBNP in the last 8760 hours. HbA1C: Recent Labs    05/30/17 1957  HGBA1C 13.5*   CBG: Recent Labs  Lab 06/01/17 1609 06/01/17 2029 06/02/17 0108 06/02/17 0403 06/02/17 0755  GLUCAP 246* 191* 144* 142* 153*   Lipid Profile: No results for input(s): CHOL, HDL, LDLCALC, TRIG, CHOLHDL, LDLDIRECT in the last 72 hours. Thyroid Function Tests: No results for input(s): TSH, T4TOTAL, FREET4, T3FREE, THYROIDAB in the last 72 hours. Anemia Panel: No results for input(s): VITAMINB12, FOLATE, FERRITIN, TIBC, IRON, RETICCTPCT in the last 72 hours. Sepsis Labs: Recent Labs  Lab 05/30/17 2140 05/30/17 2229  LATICACIDVEN 1.20 1.26    Recent Results (from the past 240 hour(s))  Blood culture (routine x 2)     Status: None (Preliminary  result)   Collection Time: 05/30/17  9:52 PM  Result Value Ref Range Status   Specimen Description BLOOD RIGHT ANTECUBITAL  Final   Special Requests IN PEDIATRIC BOTTLE Blood Culture adequate volume  Final   Culture NO GROWTH 2 DAYS  Final   Report Status PENDING  Incomplete  Blood culture (routine x 2)     Status: None (Preliminary result)   Collection Time: 05/30/17  9:58 PM  Result Value Ref Range Status   Specimen Description BLOOD RIGHT ARM  Final   Special Requests   Final    BOTTLES DRAWN AEROBIC AND ANAEROBIC Blood Culture adequate volume   Culture NO GROWTH 2 DAYS  Final   Report Status PENDING  Incomplete  MRSA PCR Screening     Status: None   Collection Time: 05/31/17  1:18 AM  Result Value Ref Range Status   MRSA by PCR NEGATIVE NEGATIVE Final    Comment:        The GeneXpert MRSA Assay (FDA approved for NASAL specimens only), is one component of a comprehensive MRSA colonization surveillance program. It is not intended to diagnose MRSA infection nor to guide  or monitor treatment for MRSA infections.        Radiology Studies: No results found.    Scheduled Meds: . enoxaparin (LOVENOX) injection  60 mg Subcutaneous Q24H  . insulin aspart  0-15 Units Subcutaneous Q4H  . insulin glargine  25 Units Subcutaneous QHS  . nicotine  14 mg Transdermal Daily   Continuous Infusions: . piperacillin-tazobactam 3.375 g (06/02/17 0544)  . vancomycin 1,500 mg (06/02/17 0831)     LOS: 2 days    Time spent: 30 minutes   Noralee StainJennifer Kameo Bains, DO [facility] [email or web address] Password [email or web address] 06/02/2017, 10:27 AM

## 2017-06-02 NOTE — Consult Note (Addendum)
WOC Nurse wound consult note Reason for Consult: Urology following for assessment and plan of care for scrotum wound. WOC requested to demonstrate dressing change process to patient for when he is discharged home. Wound type: Full thickness post I&D wound to left scrotum Measurement: .2X.2X2.5cm when swab inserted, Wound bed: Unable to visualize related to narrow opening Drainage (amount, consistency, odor) Mod amt blood-tinged drainage on previous packing when removed. Periwound: Generalized erythremia and tenderness to scrotum, hard to palpation surrounding the wound. Dressing procedure/placement/frequency: Demonstrated how to apply Iodoform packing strip to patient by using a mirror and inserting with a swab, pt states he would be able to perform this at home and verbalized understanding.  ABD pad applied and showed patient how to use mesh underwear when ambulating to hold dressing in place. He denies further questions. Bedside nurse can assist with dressing changes until patient is discharged home. Please re-consult if further assistance is needed.  Thank-you,  Cammie Mcgeeawn Khristen Cheyney MSN, RN, CWOCN, [place], CNS [phone]

## 2017-06-03 DIAGNOSIS — N492 Inflammatory disorders of scrotum: Principal | ICD-10-CM

## 2017-06-03 LAB — CBC
HEMATOCRIT: 46.1 % (ref 39.0–52.0)
Hemoglobin: 14.9 g/dL (ref 13.0–17.0)
MCH: 29 pg (ref 26.0–34.0)
MCHC: 32.3 g/dL (ref 30.0–36.0)
MCV: 89.7 fL (ref 78.0–100.0)
PLATELETS: 305 10*3/uL (ref 150–400)
RBC: 5.14 MIL/uL (ref 4.22–5.81)
RDW: 13 % (ref 11.5–15.5)
WBC: 11.6 10*3/uL — AB (ref 4.0–10.5)

## 2017-06-03 LAB — BASIC METABOLIC PANEL
Anion gap: 10 (ref 5–15)
BUN: 8 mg/dL (ref 6–20)
CALCIUM: 8.7 mg/dL — AB (ref 8.9–10.3)
CO2: 29 mmol/L (ref 22–32)
CREATININE: 0.85 mg/dL (ref 0.61–1.24)
Chloride: 100 mmol/L — ABNORMAL LOW (ref 101–111)
GFR calc Af Amer: >60 mL/min (ref >60)
GLUCOSE: 110 mg/dL — AB (ref 65–99)
Potassium: 3.9 mmol/L (ref 3.5–5.1)
Sodium: 139 mmol/L (ref 135–145)

## 2017-06-03 LAB — VANCOMYCIN, TROUGH: Vancomycin Tr: 7 ug/mL — ABNORMAL LOW (ref 15–20)

## 2017-06-03 LAB — GLUCOSE, CAPILLARY
Glucose-Capillary: 95 mg/dL (ref 65–99)
Glucose-Capillary: 101 mg/dL — ABNORMAL HIGH (ref 65–99)
Glucose-Capillary: 123 mg/dL — ABNORMAL HIGH (ref 65–99)

## 2017-06-03 MED ORDER — SULFAMETHOXAZOLE-TRIMETHOPRIM 800-160 MG PO TABS: 2.0000 | ORAL_TABLET | Freq: Two times a day (BID) | ORAL | 0 refills | Status: AC

## 2017-06-03 MED ORDER — BLOOD GLUCOSE METER KIT: PACK | 0 refills | Status: DC

## 2017-06-03 MED ORDER — INSULIN GLARGINE 100 UNIT/ML ~~LOC~~ SOLN: 25.0000 [IU] | Freq: Every day | SUBCUTANEOUS | 0 refills | Status: DC

## 2017-06-03 MED ORDER — "INSULIN SYRINGE 31G X 5/16"" 0.3 ML MISC": 0 refills | Status: DC

## 2017-06-03 MED ORDER — INSULIN ASPART 100 UNIT/ML ~~LOC~~ SOLN: SUBCUTANEOUS | 0 refills | Status: DC

## 2017-06-03 MED ORDER — NICOTINE 14 MG/24HR TD PT24: 14.0000 mg | MEDICATED_PATCH | Freq: Every day | TRANSDERMAL | 0 refills | Status: DC

## 2017-06-03 NOTE — Discharge Instructions (Signed)
Check your blood sugar 4 times daily: before breakfast, before lunch, before dinner, before sleep. You may also check throughout the day if you feel that your blood sugar is too low or too high.   Lantus is your long-acting insulin. This should be used at the same time each day. Start with 25 units daily at nighttime.   Novolog is your short-acting correction insulin. Check your blood sugar prior to your meal. Then depending on your blood sugar, inject insulin as follows: BG 121 - 150: 1 units  BG 151 - 200: 2 units  BG 201 - 250: 3 units  BG 251 - 300: 5 units  BG 301 - 350: 7 units  BG 351 - 400: 9 units   Keep a journal of your blood sugar results each day. Bring to your primary care physician.     Blood Glucose Monitoring, Adult Monitoring your blood sugar (glucose) helps you manage your diabetes. It also helps you and your health care provider determine how well your diabetes management plan is working. Blood glucose monitoring involves checking your blood glucose as often as directed, and keeping a record (log) of your results over time. Why should I monitor my blood glucose? Checking your blood glucose regularly can:  Help you understand how food, exercise, illnesses, and medicines affect your blood glucose.  Let you know what your blood glucose is at any time. You can quickly tell if you are having low blood glucose (hypoglycemia) or high blood glucose (hyperglycemia).  Help you and your health care provider adjust your medicines as needed.  When should I check my blood glucose? Follow instructions from your health care provider about how often to check your blood glucose. This may depend on:  The type of diabetes you have.  How well-controlled your diabetes is.  Medicines you are taking.  If you have type 1 diabetes:  Check your blood glucose at least 2 times a day.  Also check your blood glucose: ? Before every insulin injection. ? Before and after  exercise. ? Between meals. ? 2 hours after a meal. ? Occasionally between 2:00 a.m. and 3:00 a.m., as directed. ? Before potentially dangerous tasks, like driving or using heavy machinery. ? At bedtime.  You may need to check your blood glucose more often, up to 6-10 times a day: ? If you use an insulin pump. ? If you need multiple daily injections (MDI). ? If your diabetes is not well-controlled. ? If you are ill. ? If you have a history of severe hypoglycemia. ? If you have a history of not knowing when your blood glucose is getting low (hypoglycemia unawareness). If you have type 2 diabetes:  If you take insulin or other diabetes medicines, check your blood glucose at least 2 times a day.  If you are on intensive insulin therapy, check your blood glucose at least 4 times a day. Occasionally, you may also need to check between 2:00 a.m. and 3:00 a.m., as directed.  Also check your blood glucose: ? Before and after exercise. ? Before potentially dangerous tasks, like driving or using heavy machinery.  You may need to check your blood glucose more often if: ? Your medicine is being adjusted. ? Your diabetes is not well-controlled. ? You are ill. What is a blood glucose log?  A blood glucose log is a record of your blood glucose readings. It helps you and your health care provider: ? Look for patterns in your blood glucose over  time. ? Adjust your diabetes management plan as needed.  Every time you check your blood glucose, write down your result and notes about things that may be affecting your blood glucose, such as your diet and exercise for the day.  Most glucose meters store a record of glucose readings in the meter. Some meters allow you to download your records to a computer. How do I check my blood glucose? Follow these steps to get accurate readings of your blood glucose: Supplies needed   Blood glucose meter.  Test strips for your meter. Each meter has its own  strips. You must use the strips that come with your meter.  A needle to prick your finger (lancet). Do not use lancets more than once.  A device that holds the lancet (lancing device).  A journal or log book to write down your results. Procedure  Wash your hands with soap and water.  Prick the side of your finger (not the tip) with the lancet. Use a different finger each time.  Gently rub the finger until a small drop of blood appears.  Follow instructions that come with your meter for inserting the test strip, applying blood to the strip, and using your blood glucose meter.  Write down your result and any notes. Alternative testing sites  Some meters allow you to use areas of your body other than your finger (alternative sites) to test your blood.  If you think you may have hypoglycemia, or if you have hypoglycemia unawareness, do not use alternative sites. Use your finger instead.  Alternative sites may not be as accurate as the fingers, because blood flow is slower in these areas. This means that the result you get may be delayed, and it may be different from the result that you would get from your finger.  The most common alternative sites are: ? Forearm. ? Thigh. ? Palm of the hand. Additional tips  Always keep your supplies with you.  If you have questions or need help, all blood glucose meters have a 24-hour hotline number that you can call. You may also contact your health care provider.  After you use a few boxes of test strips, adjust (calibrate) your blood glucose meter by following instructions that came with your meter. This information is not intended to replace advice given to you by your health care provider. Make sure you discuss any questions you have with your health care provider. Document Released: 05/05/2003 Document Revised: 11/20/2015 Document Reviewed: 10/12/2015 Elsevier Interactive Patient Education  2017 [employer].

## 2017-06-03 NOTE — Discharge Summary (Signed)
Physician Discharge Summary  Shawn Patrick GHW:[id number] DOB: 17-Sep-1970 DOA: 05/30/2017  PCP: Patient, No Pcp Per  Admit date: 05/30/2017 Discharge date: 06/03/2017  Admitted From: Home Disposition:  Home   Recommendations for Outpatient Follow-up:  1. Follow up with PCP in 1 week 2. Follow up with Dr. Tresa Moore Urology as needed 3. Please follow up on the following pending results: culture results from I&D  4. Would recommend sleep study as outpatient due to concern for hypercapnic respiratory failure   Home Health: RN for wound care  Equipment/Devices: None   Discharge Condition: Stable CODE STATUS: Full Diet recommendation: Carb modified    Brief/Interim Summary: Shawn Patrick a 47 y.o.malewithhistory of tobacco abusewho has not been to a physician for many years presents to the ER with complaints of worsening pain and swelling of the scrotal area. Patient states that he noticed a small knot-like area in the left groin area for last 1 week.Day prior to admission,it became suddenly worse and involving the whole scrotum mostly on the left side with erythemaandpain. In the ER patient had CT pelvis done which shows features concerning for developing phlegmon. Urology was consulted and was recommended for IV antibiotics and blood sugar control.   He was treated with IV vancomycin and Zosyn.  Due to new drainage of the left upper scrotum, urology reevaluated patient and patient underwent bedside incision and drainage on 1/18.  Dr. Tresa Moore recommended continued packing and dressing changes.  Home health RN was ordered at discharge for wound care.  Antibiotic was de-escalated to Bactrim for 10-day course.  During his hospitalization, he was also found to have acute hypercarbic respiratory failure.  He was treated with BiPAP with improvement.  He does need a sleep study as an outpatient for CPAP evaluation.  He was also diagnosed with new diabetes with uncontrolled blood sugar, A1c 13.5.   DM Coordinator and dietitian were consulted.  Patient was started on insulin.  Case manager also assisted with medication assistance.  Discharge Diagnoses:  Principal Problem:   Cellulitis of scrotum Active Problems:   Hyperglycemia   Cellulitis   Cellulitis, scrotum   Scrotal cellulitis with abscess  -US scrotum revealed prominent diffused subcutaneous edema throughout scrotum L>R -CT pelvis revealed moderate to large scrotal fluid collections, asymmetric edema and soft tissue thickening in upper left scrotum, suspicious for phlegmon, no well defined abscess  -Gonorrhea/Chlamydia negative, HIV negative. Blood cultures NGTD  -S/p I&D 1/18 by Dr. Tresa Moore, urology. Continue packing and dressing changes.  -Deescalate antibiotics to cover purulent cellulitis, bactrim to cover MRSA   Acute hypercarbic respiratory failure due to OSA/OHS -Patient admits to snoring at night, wakes up every 45-60 minutes when sleeping, does not wake up refreshed, daytime fatigue. Definitely needs sleep study as outpatient -ABG improved although remains hypercarbic -Will need sleep study as outpatient   Uncontrolled new dx DM  -Ha1c 13.5 -DM coordinator consulted -Continue insulin. Blood sugar improved   Tobacco abuse -Nicotine patch. Cessation counseling.      Discharge Instructions  Discharge Instructions    Call MD for:  difficulty breathing, headache or visual disturbances   Complete by:  As directed    Call MD for:  extreme fatigue   Complete by:  As directed    Call MD for:  hives   Complete by:  As directed    Call MD for:  persistant dizziness or light-headedness   Complete by:  As directed    Call MD for:  persistant nausea and vomiting  Complete by:  As directed    Call MD for:  redness, tenderness, or signs of infection (pain, swelling, redness, odor or green/yellow discharge around incision site)   Complete by:  As directed    Call MD for:  severe uncontrolled pain   Complete by:   As directed    Call MD for:  temperature >100.4   Complete by:  As directed    Diet - low sodium heart healthy   Complete by:  As directed    Discharge instructions   Complete by:  As directed    You were cared for by a hospitalist during your hospital stay. If you have any questions about your discharge medications or the care you received while you were in the hospital after you are discharged, you can call the unit and asked to speak with the hospitalist on call if the hospitalist that took care of you is not available. Once you are discharged, your primary care physician will handle any further medical issues. Please note that NO REFILLS for any discharge medications will be authorized once you are discharged, as it is imperative that you return to your primary care physician (or establish a relationship with a primary care physician if you do not have one) for your aftercare needs so that they can reassess your need for medications and monitor your lab values.   Discharge wound care:   Complete by:  As directed    apply Iodoform packing strip by inserting with a swab, use mesh underwear when ambulating to hold dressing in place   Increase activity slowly   Complete by:  As directed      Allergies as of 06/03/2017   No Known Allergies     Medication List    STOP taking these medications   amoxicillin 500 MG capsule Commonly known as:  AMOXIL   naproxen 500 MG tablet Commonly known as:  NAPROSYN     TAKE these medications   acetaminophen 500 MG tablet Commonly known as:  TYLENOL Take 500 mg by mouth every 6 (six) hours as needed (for pain or headaches).   ADVIL PM 200-25 MG Caps Generic drug:  Ibuprofen-Diphenhydramine HCl Take 1-2 capsules by mouth at bedtime as needed (for sleep).   blood glucose meter kit and supplies Dispense based on patient and insurance preference. Use up to four times daily as directed. (FOR ICD-10 E10.9, E11.9).   insulin aspart 100 UNIT/ML  injection Commonly known as:  NOVOLOG Sliding scale: 121 - 150: 1 units, 151 - 200: 2 units, 201 - 250: 3 units, 251 - 300: 5 units, 301 - 350: 7 units, 351 - 400: 9 units   insulin glargine 100 UNIT/ML injection Commonly known as:  LANTUS Inject 0.25 mLs (25 Units total) into the skin at bedtime.   INSULIN SYRINGE .3CC/31GX5/16" 31G X 5/16" 0.3 ML Misc Use up to 4 times daily with insulin   naproxen sodium 220 MG tablet Commonly known as:  ALEVE Take 220-440 mg by mouth daily as needed (for pain or headaches).   nicotine 14 mg/24hr patch Commonly known as:  NICODERM CQ - dosed in mg/24 hours Place 1 patch (14 mg total) onto the skin daily.   sulfamethoxazole-trimethoprim 800-160 MG tablet Commonly known as:  BACTRIM DS,SEPTRA DS Take 2 tablets by mouth every 12 (twelve) hours for 10 days.            Discharge Care Instructions  (From admission, onward)  Start     Ordered   06/03/17 0000  Discharge wound care:    Comments:  apply Iodoform packing strip by inserting with a swab, use mesh underwear when ambulating to hold dressing in place   06/03/17 0900     Follow-up Information    Alfonse Spruce, FNP. Go on 06/08/2017.   Specialty:  Family Medicine Why:  hospital f/u and to establish care- appoitment at 4:00pm- if you are unable to keep the appointment please call the clinic. Pharmacy onsite medications range from $4.00-$10.00 Contact information: [place] [address] [phone]        Alexis Frock, MD Follow up.   Specialty:  Urology Why:  As needed with questions  Contact information: [place] [address] [phone]          No Known Allergies  Consultations:  Urology    Procedures/Studies: Ct Pelvis W Contrast  Result Date: 05/30/2017 CLINICAL DATA:  Testicular pain and swelling with abscess to the right groin EXAM: CT PELVIS WITH CONTRAST TECHNIQUE: Multidetector CT imaging of the pelvis  was performed using the standard protocol following the bolus administration of intravenous contrast. CONTRAST:  146m ISOVUE-300 IOPAMIDOL (ISOVUE-300) INJECTION 61% COMPARISON:  CT abdomen pelvis 01/27/2016 FINDINGS: Urinary Tract:  No abnormality visualized. Bowel:  Unremarkable visualized pelvic bowel loops. Vascular/Lymphatic: Mild aortic atherosclerosis. No aneurysmal dilatation. Small external iliac nodes, measuring 8 mm on the right and 7 mm on the left. Reproductive: Large scrotal fluid collections. Fatty inguinal hernias. Asymmetric edema and skin thickening to the left scrotum with focal nodular soft tissue thickening present, series 3, image number 64. No well-defined rim enhancing fluid collection. No soft tissue gas. Prostate unremarkable Other: Moderate fatty umbilical and periumbilical hernia. Negative for free fluid in the pelvis Musculoskeletal: No suspicious bone lesions identified. IMPRESSION: 1. Moderate to large scrotal fluid collections. Asymmetric edema and soft tissue thickening in the upper left scrotum, suspicious for a phlegmon; no well-defined rim enhancing soft tissue abscess noted by CT 2. Mild external iliac adenopathy likely reactive Electronically Signed   By: KDonavan FoilM.D.   On: 05/30/2017 23:39   [place]oreaScrotum  Result Date: 05/31/2017 CLINICAL DATA:  Scrotal swelling for 1 week. EXAM: SCROTAL ULTRASOUND DOPPLER ULTRASOUND OF THE TESTICLES TECHNIQUE: Complete ultrasound examination of the testicles, epididymis, and other scrotal structures was performed. Color and spectral Doppler ultrasound were also utilized to evaluate blood flow to the testicles. COMPARISON:  05/30/2017 CT pelvis. FINDINGS: Right testicle Measurements: 4.6 x 2.5 x 3.1 cm. No mass or microlithiasis visualized. Left testicle Measurements: 4.2 x 2.9 x 3.1 cm. No mass or microlithiasis visualized. Right epididymis: Simple 6 x 5 x 4 mm cyst versus spermatocele in the right epididymal head. Otherwise normal  right epididymis. Left epididymis:  Normal in size and appearance. Hydrocele:  No significant hydrocele. Varicocele:  Mild left varicocele.  No right varicocele. Prominent diffuse subcutaneous edema throughout the scrotum, left greater than right. Pulsed Doppler interrogation of both testes demonstrates normal low resistance arterial and venous waveforms bilaterally. IMPRESSION: 1. No evidence of testicular torsion. Normal testes with no testicular mass. 2. Prominent diffuse subcutaneous edema throughout the scrotum, left greater than right. No significant hydroceles. 3. Mild left varicocele. 4. Simple subcentimeter cyst versus spermatocele in the right epididymal head. Otherwise normal epididymides. Electronically Signed   By: JIlona SorrelM.D.   On: 05/31/2017 01:05   [place]Art/ven Flow Abd Pelv Doppler  Result Date: 05/31/2017 CLINICAL  DATA:  Scrotal swelling for 1 week. EXAM: SCROTAL ULTRASOUND DOPPLER ULTRASOUND OF THE TESTICLES TECHNIQUE: Complete ultrasound examination of the testicles, epididymis, and other scrotal structures was performed. Color and spectral Doppler ultrasound were also utilized to evaluate blood flow to the testicles. COMPARISON:  05/30/2017 CT pelvis. FINDINGS: Right testicle Measurements: 4.6 x 2.5 x 3.1 cm. No mass or microlithiasis visualized. Left testicle Measurements: 4.2 x 2.9 x 3.1 cm. No mass or microlithiasis visualized. Right epididymis: Simple 6 x 5 x 4 mm cyst versus spermatocele in the right epididymal head. Otherwise normal right epididymis. Left epididymis:  Normal in size and appearance. Hydrocele:  No significant hydrocele. Varicocele:  Mild left varicocele.  No right varicocele. Prominent diffuse subcutaneous edema throughout the scrotum, left greater than right. Pulsed Doppler interrogation of both testes demonstrates normal low resistance arterial and venous waveforms bilaterally. IMPRESSION: 1. No evidence of testicular torsion. Normal testes with no testicular  mass. 2. Prominent diffuse subcutaneous edema throughout the scrotum, left greater than right. No significant hydroceles. 3. Mild left varicocele. 4. Simple subcentimeter cyst versus spermatocele in the right epididymal head. Otherwise normal epididymides. Electronically Signed   By: Ilona Sorrel M.D.   On: 05/31/2017 01:05   Dg Chest Port 1 View  Result Date: 05/31/2017 CLINICAL DATA:  47 year old male with hypoxia. EXAM: PORTABLE CHEST 1 VIEW COMPARISON:  Chest CT dated 03/21/2016 FINDINGS: Mild diffuse interstitial coarsening and chronic bronchitic changes. No focal consolidation, pleural effusion, or pneumothorax. The cardiac silhouette is within normal limits. Mild widening of the right AC joint, chronic. No acute osseous pathology. IMPRESSION: No active disease. Electronically Signed   By: Anner Crete M.D.   On: 05/31/2017 06:01      Discharge Exam: Vitals:   06/03/17 0334 06/03/17 0754  BP: (!) 145/84 (!) 145/86  Pulse: 77 74  Resp: (!) 21 16  Temp: 97.7 F (36.5 C) 98.3 F (36.8 C)  SpO2: 93% 91%   Vitals:   06/02/17 1952 06/02/17 2354 06/03/17 0334 06/03/17 0754  BP: (!) 145/87 122/67 (!) 145/84 (!) 145/86  Pulse: 81 78 77 74  Resp: 17 15 (!) 21 16  Temp: 98.8 F (37.1 C) 97.6 F (36.4 C) 97.7 F (36.5 C) 98.3 F (36.8 C)  TempSrc: Oral Oral Oral Axillary  SpO2: 94% 97% 93% 91%  Weight:   130.3 kg (287 lb 4.8 oz)   Height:        General: Pt is alert, awake, not in acute distress Cardiovascular: RRR, S1/S2 +, no rubs, no gallops Respiratory: CTA bilaterally, no wheezing, no rhonchi Abdominal: Soft, NT, ND, bowel sounds + Extremities: no edema, no cyanosis GU: scrotum with erythema and edema which has improved, left upper scrotum with packing in place    The results of significant diagnostics from this hospitalization (including imaging, microbiology, ancillary and laboratory) are listed below for reference.     Microbiology: Recent Results (from the past  240 hour(s))  Blood culture (routine x 2)     Status: None (Preliminary result)   Collection Time: 05/30/17  9:52 PM  Result Value Ref Range Status   Specimen Description BLOOD RIGHT ANTECUBITAL  Final   Special Requests IN PEDIATRIC BOTTLE Blood Culture adequate volume  Final   Culture NO GROWTH 3 DAYS  Final   Report Status PENDING  Incomplete  Blood culture (routine x 2)     Status: None (Preliminary result)   Collection Time: 05/30/17  9:58 PM  Result Value Ref Range Status  Specimen Description BLOOD RIGHT ARM  Final   Special Requests   Final    BOTTLES DRAWN AEROBIC AND ANAEROBIC Blood Culture adequate volume   Culture NO GROWTH 3 DAYS  Final   Report Status PENDING  Incomplete  MRSA PCR Screening     Status: None   Collection Time: 05/31/17  1:18 AM  Result Value Ref Range Status   MRSA by PCR NEGATIVE NEGATIVE Final    Comment:        The GeneXpert MRSA Assay (FDA approved for NASAL specimens only), is one component of a comprehensive MRSA colonization surveillance program. It is not intended to diagnose MRSA infection nor to guide or monitor treatment for MRSA infections.   Aerobic/Anaerobic Culture (surgical/deep wound)     Status: None (Preliminary result)   Collection Time: 06/02/17  3:44 PM  Result Value Ref Range Status   Specimen Description SCROTUM  Final   Special Requests Normal  Final   Gram Stain   Final    MODERATE WBC PRESENT,BOTH PMN AND MONONUCLEAR MODERATE GRAM POSITIVE COCCI MODERATE GRAM POSITIVE RODS    Culture PENDING  Incomplete   Report Status PENDING  Incomplete     Labs: BNP (last 3 results) Recent Labs    05/31/17 0544  BNP 56.4   Basic Metabolic Panel: Recent Labs  Lab 05/30/17 1957 05/31/17 0533 06/01/17 0511 06/02/17 0443 06/03/17 0754  NA 129* 131* 138 139 139  K 3.6 3.8 4.0 4.0 3.9  CL 92* 95* 99* 100* 100*  CO2 24 24 29 29 29   GLUCOSE 337* 328* 197* 169* 110*  BUN 10 10 8 9 8   CREATININE 0.85 0.78 0.68 0.76  0.85  CALCIUM 8.9 8.4* 8.5* 8.5* 8.7*   Liver Function Tests: No results for input(s): AST, ALT, ALKPHOS, BILITOT, PROT, ALBUMIN in the last 168 hours. No results for input(s): LIPASE, AMYLASE in the last 168 hours. No results for input(s): AMMONIA in the last 168 hours. CBC: Recent Labs  Lab 05/30/17 1957 05/31/17 0533 06/01/17 0511 06/02/17 0443 06/03/17 0754  WBC 20.6* 16.2* 12.5* 12.3* 11.6*  NEUTROABS 16.3*  --   --   --   --   HGB 17.2* 15.9 15.5 14.8 14.9  HCT 49.4 46.9 47.7 46.1 46.1  MCV 86.1 87.3 90.3 90.0 89.7  PLT 292 260 266 284 305   Cardiac Enzymes: Recent Labs  Lab 05/31/17 0533  TROPONINI <0.03   BNP: Invalid input(s): POCBNP CBG: Recent Labs  Lab 06/02/17 1733 06/02/17 1955 06/02/17 2357 06/03/17 0333 06/03/17 0745  GLUCAP 137* 133* 145* 95 101*   D-Dimer No results for input(s): DDIMER in the last 72 hours. Hgb A1c No results for input(s): HGBA1C in the last 72 hours. Lipid Profile No results for input(s): CHOL, HDL, LDLCALC, TRIG, CHOLHDL, LDLDIRECT in the last 72 hours. Thyroid function studies No results for input(s): TSH, T4TOTAL, T3FREE, THYROIDAB in the last 72 hours.  Invalid input(s): FREET3 Anemia work up No results for input(s): VITAMINB12, FOLATE, FERRITIN, TIBC, IRON, RETICCTPCT in the last 72 hours. Urinalysis    Component Value Date/Time   COLORURINE YELLOW 05/31/2017 0453   APPEARANCEUR CLEAR 05/31/2017 0453   LABSPEC >1.046 (H) 05/31/2017 0453   PHURINE 5.0 05/31/2017 0453   GLUCOSEU >=500 (A) 05/31/2017 0453   HGBUR NEGATIVE 05/31/2017 [place] 05/31/2017 0453   KETONESUR NEGATIVE 05/31/2017 0453   PROTEINUR 30 (A) 05/31/2017 0453   UROBILINOGEN 0.2 01/12/2015 1036   NITRITE NEGATIVE 05/31/2017 0453  LEUKOCYTESUR NEGATIVE 05/31/2017 0453   Sepsis Labs Invalid input(s): PROCALCITONIN,  WBC,  LACTICIDVEN Microbiology Recent Results (from the past 240 hour(s))  Blood culture (routine x 2)      Status: None (Preliminary result)   Collection Time: 05/30/17  9:52 PM  Result Value Ref Range Status   Specimen Description BLOOD RIGHT ANTECUBITAL  Final   Special Requests IN PEDIATRIC BOTTLE Blood Culture adequate volume  Final   Culture NO GROWTH 3 DAYS  Final   Report Status PENDING  Incomplete  Blood culture (routine x 2)     Status: None (Preliminary result)   Collection Time: 05/30/17  9:58 PM  Result Value Ref Range Status   Specimen Description BLOOD RIGHT ARM  Final   Special Requests   Final    BOTTLES DRAWN AEROBIC AND ANAEROBIC Blood Culture adequate volume   Culture NO GROWTH 3 DAYS  Final   Report Status PENDING  Incomplete  MRSA PCR Screening     Status: None   Collection Time: 05/31/17  1:18 AM  Result Value Ref Range Status   MRSA by PCR NEGATIVE NEGATIVE Final    Comment:        The GeneXpert MRSA Assay (FDA approved for NASAL specimens only), is one component of a comprehensive MRSA colonization surveillance program. It is not intended to diagnose MRSA infection nor to guide or monitor treatment for MRSA infections.   Aerobic/Anaerobic Culture (surgical/deep wound)     Status: None (Preliminary result)   Collection Time: 06/02/17  3:44 PM  Result Value Ref Range Status   Specimen Description SCROTUM  Final   Special Requests Normal  Final   Gram Stain   Final    MODERATE WBC PRESENT,BOTH PMN AND MONONUCLEAR MODERATE GRAM POSITIVE COCCI MODERATE GRAM POSITIVE RODS    Culture PENDING  Incomplete   Report Status PENDING  Incomplete     Time coordinating discharge: 40 minutes  SIGNED:  Dessa Phi, DO [facility] Pager [phone]  If 7PM-7AM, please contact night-coverage [email or web address] Password [email or web address] 06/03/2017, 10:56 AM

## 2017-06-03 NOTE — Progress Notes (Signed)
MATCH ( [facility]) given to patient - he cannot afford his medication and is eligible for the program that will assist the patient in getting a 34 day supply of medication and [facility] for [facility]. All questions answered. Abelino DerrickB Ashleen Demma Physicians Eye Surgery CenterRN,MHA,BSN [phone]

## 2017-06-03 NOTE — Progress Notes (Signed)
Pt iodoform dsg done as per order prior to discharge.

## 2017-06-04 LAB — CULTURE, BLOOD (ROUTINE X 2)
CULTURE: NO GROWTH
Culture: NO GROWTH
SPECIAL REQUESTS: ADEQUATE
Special Requests: ADEQUATE

## 2017-06-07 ENCOUNTER — Other Ambulatory Visit: Payer: Self-pay

## 2017-06-07 ENCOUNTER — Encounter (HOSPITAL_COMMUNITY): Payer: Self-pay | Admitting: Emergency Medicine

## 2017-06-07 ENCOUNTER — Emergency Department (HOSPITAL_COMMUNITY)
Admission: EM | Admit: 2017-06-07 | Discharge: 2017-06-07 | Disposition: A | Discharge status: Home / Self Care | Payer: Self-pay | Attending: Emergency Medicine | Admitting: Emergency Medicine

## 2017-06-07 DIAGNOSIS — Z794 Long term (current) use of insulin: Secondary | ICD-10-CM | POA: Insufficient documentation

## 2017-06-07 DIAGNOSIS — Z4801 Encounter for change or removal of surgical wound dressing: Secondary | ICD-10-CM | POA: Insufficient documentation

## 2017-06-07 DIAGNOSIS — F17210 Nicotine dependence, cigarettes, uncomplicated: Secondary | ICD-10-CM | POA: Insufficient documentation

## 2017-06-07 DIAGNOSIS — R739 Hyperglycemia, unspecified: Secondary | ICD-10-CM | POA: Insufficient documentation

## 2017-06-07 DIAGNOSIS — Z79899 Other long term (current) drug therapy: Secondary | ICD-10-CM | POA: Insufficient documentation

## 2017-06-07 DIAGNOSIS — N492 Inflammatory disorders of scrotum: Secondary | ICD-10-CM | POA: Insufficient documentation

## 2017-06-07 DIAGNOSIS — Z09 Encounter for follow-up examination after completed treatment for conditions other than malignant neoplasm: Secondary | ICD-10-CM

## 2017-06-07 DIAGNOSIS — I10 Essential (primary) hypertension: Secondary | ICD-10-CM | POA: Insufficient documentation

## 2017-06-07 NOTE — ED Provider Notes (Signed)
[facility] EMERGENCY DEPARTMENT Provider Note   CSN: [id number] Arrival date & time: 06/07/17  6063     History   Chief Complaint Chief Complaint  Patient presents with  . Wound Check    HPI Shawn Patrick is a 47 y.o. male.  HPI   47 year old male presenting for evaluation of a wound recheck. Patient was seen on June 15 for evaluation of scrotal swelling. At which time, CT scan demonstrating evidence of cellulitis with developing phlegmon. Patient was admitted to the hospital and receiving IV antibiotics. During her hospitalization course, he received an incision and drainage procedure done by urologist on Jan 18th. Packing was placed. Patient discharged home with Bactrim for 10 day course.patient is here today because he is having difficulty putting packing in to this wound. States that he's been change in packing daily as recommended but he felt the whole is closing in and he could not advance his packing. He denies worsening pain or fever he is still taking antibiotic as prescribed.  Past Medical History:  Diagnosis Date  . Hypertension   . Obesity     Patient Active Problem List   Diagnosis Date Noted  . Cellulitis of scrotum 05/31/2017  . Hyperglycemia 05/31/2017  . Cellulitis 05/31/2017  . Cellulitis, scrotum 05/31/2017    History reviewed. No pertinent surgical history.     Home Medications    Prior to Admission medications   Medication Sig Start Date End Date Taking? Authorizing Provider  acetaminophen (TYLENOL) 500 MG tablet Take 500 mg by mouth every 6 (six) hours as needed (for pain or headaches).     [provider]  blood glucose meter kit and supplies Dispense based on patient and insurance preference. Use up to four times daily as directed. (FOR ICD-10 E10.9, E11.9). 06/03/17   Dessa Phi, DO  Ibuprofen-Diphenhydramine HCl (ADVIL PM) 200-25 MG CAPS Take 1-2 capsules by mouth at bedtime as needed (for sleep).    [provider]  insulin aspart (NOVOLOG) 100 UNIT/ML injection Sliding scale: 121 - 150: 1 units, 151 - 200: 2 units, 201 - 250: 3 units, 251 - 300: 5 units, 301 - 350: 7 units, 351 - 400: 9 units 06/03/17   Dessa Phi, DO  insulin glargine (LANTUS) 100 UNIT/ML injection Inject 0.25 mLs (25 Units total) into the skin at bedtime. 06/03/17   Dessa Phi, DO  Insulin Syringe-Needle U-100 (INSULIN SYRINGE .3CC/31GX5/16") 31G X 5/16" 0.3 ML MISC Use up to 4 times daily with insulin 06/03/17   Dessa Phi, DO  naproxen sodium (ALEVE) 220 MG tablet Take 220-440 mg by mouth daily as needed (for pain or headaches).    [provider]  nicotine (NICODERM CQ - DOSED IN MG/24 HOURS) 14 mg/24hr patch Place 1 patch (14 mg total) onto the skin daily. 06/03/17   Dessa Phi, DO  sulfamethoxazole-trimethoprim (BACTRIM DS,SEPTRA DS) 800-160 MG tablet Take 2 tablets by mouth every 12 (twelve) hours for 10 days. 06/03/17 06/13/17  Dessa Phi, DO    Family History Family History  Problem Relation Age of Onset  . Hypertension Other     Social History Social History   Tobacco Use  . Smoking status: Current Every Day Smoker    Packs/day: 1.00    Types: Cigarettes  . Smokeless tobacco: Never Used  Substance Use Topics  . Alcohol use: No  . Drug use: No    Comment: h/o smoking marijuana quit     Allergies   Patient  has no known allergies.   Review of Systems Review of Systems  Constitutional: Negative for fever.  Skin: Positive for wound.     Physical Exam Updated Vital Signs BP 136/75 (BP Location: Right Arm)   Pulse 89   Temp 97.7 F (36.5 C) (Oral)   Resp 18   SpO2 91%   Physical Exam  Constitutional: He appears well-developed and well-nourished. No distress.  Obese male nontoxic in appearance  HENT:  Head: Atraumatic.  Eyes: Conjunctivae are normal.  Neck: Neck supple.  Genitourinary:  Genitourinary Comments: Chaperone present during exam. An area of induration  measuring approximately 3 x 5 cm noted to the left proximal scrotal wall with an incision opening and no discharge noted minimal tenderness to palpation. No surrounding erythema  Neurological: He is alert.  Skin: No rash noted.  Psychiatric: He has a normal mood and affect.  Nursing note and vitals reviewed.    ED Treatments / Results  Labs (all labs ordered are listed, but only abnormal results are displayed) Labs Reviewed - No data to display  EKG  EKG Interpretation None       Radiology No results found.  Procedures Procedures (including critical care time)  Medications Ordered in ED Medications - No data to display   Initial Impression / Assessment and Plan / ED Course  I have reviewed the triage vital signs and the nursing notes.  Pertinent labs & imaging results that were available during my care of the patient were reviewed by me and considered in my medical decision making (see chart for details).     BP 136/75 (BP Location: Right Arm)   Pulse 89   Temp 97.7 F (36.5 C) (Oral)   Resp 18   SpO2 91%    Final Clinical Impressions(s) / ED Diagnoses   Final diagnoses:  Encounter for recheck of abscess following incision and drainage    ED Discharge Orders    None     9:06 AM Patient here for evaluation of a scrotal abscess that was incised and drained recently. He is been changing his packing daily but today he was unable to put any packing in. On exam, he does have an opening amenable for packing. I was able to put approximately 4 inches of packing in place without any difficulty. Encouraged patient to continue with antibiotic and packing changes as recommended. He should follow-up with urologist for the management. Return precaution discussed.   Domenic Moras, PA-C 06/07/17 [id number]    Drenda Freeze, MD 06/07/17 Drema Halon

## 2017-06-07 NOTE — ED Triage Notes (Signed)
Pt returning to ER for concern related to recent scrotal abscess I/D, states "it is hard" and "I can't get the packing in." pt in NAD. Denies fever. States is compliant with antibiotics.

## 2017-06-07 NOTE — Discharge Instructions (Signed)
Please continue with dressing changes and packing as previously recommended.  Return if you have any concerns.

## 2017-06-07 NOTE — ED Notes (Signed)
Wound seen and packed per PA

## 2017-06-08 ENCOUNTER — Ambulatory Visit: Payer: Self-pay | Attending: Family Medicine | Admitting: Family Medicine

## 2017-06-08 ENCOUNTER — Encounter: Payer: Self-pay | Admitting: Family Medicine

## 2017-06-08 VITALS — BP 111/76 | HR 86 | Temp 98.4°F | Resp 18 | Ht 70.0 in | Wt 276.0 lb

## 2017-06-08 DIAGNOSIS — E1142 Type 2 diabetes mellitus with diabetic polyneuropathy: Secondary | ICD-10-CM | POA: Insufficient documentation

## 2017-06-08 DIAGNOSIS — R5383 Other fatigue: Secondary | ICD-10-CM | POA: Insufficient documentation

## 2017-06-08 DIAGNOSIS — R0683 Snoring: Secondary | ICD-10-CM | POA: Insufficient documentation

## 2017-06-08 DIAGNOSIS — H539 Unspecified visual disturbance: Secondary | ICD-10-CM | POA: Insufficient documentation

## 2017-06-08 DIAGNOSIS — E119 Type 2 diabetes mellitus without complications: Secondary | ICD-10-CM

## 2017-06-08 DIAGNOSIS — Z79899 Other long term (current) drug therapy: Secondary | ICD-10-CM | POA: Insufficient documentation

## 2017-06-08 DIAGNOSIS — R400 Somnolence: Secondary | ICD-10-CM

## 2017-06-08 DIAGNOSIS — N492 Inflammatory disorders of scrotum: Secondary | ICD-10-CM | POA: Insufficient documentation

## 2017-06-08 DIAGNOSIS — Z794 Long term (current) use of insulin: Secondary | ICD-10-CM | POA: Insufficient documentation

## 2017-06-08 LAB — AEROBIC/ANAEROBIC CULTURE (SURGICAL/DEEP WOUND): CULTURE: NORMAL

## 2017-06-08 LAB — AEROBIC/ANAEROBIC CULTURE W GRAM STAIN (SURGICAL/DEEP WOUND): Special Requests: NORMAL

## 2017-06-08 MED ORDER — GABAPENTIN 300 MG PO CAPS: 300.0000 mg | ORAL_CAPSULE | Freq: Every day | ORAL | 2 refills | Status: DC

## 2017-06-08 MED ORDER — INSULIN ASPART 100 UNIT/ML ~~LOC~~ SOLN: SUBCUTANEOUS | 5 refills | Status: DC

## 2017-06-08 MED ORDER — INSULIN GLARGINE 100 UNIT/ML ~~LOC~~ SOLN
25.0000 [IU] | Freq: Every day | SUBCUTANEOUS | 4 refills | Status: DC
Start: 2017-06-08 — End: 2017-07-10

## 2017-06-08 MED FILL — !NOVOLOG 100UNITS/ML VIAL: 100/ML | 28 days supply | Qty: 10 | Fill #0

## 2017-06-08 MED FILL — GABAPENTIN 300 MG CAPSULE: 300 | 30 days supply | Qty: 30 | Fill #0

## 2017-06-08 MED FILL — !LANTUS 100 UNITS/ML VIAL: 100 | 28 days supply | Qty: 10 | Fill #0

## 2017-06-08 NOTE — Patient Instructions (Signed)
Type 2 Diabetes Mellitus, Self Care, Adult When you have type 2 diabetes (type 2 diabetes mellitus), you must keep your blood sugar (glucose) under control. You can do this with:  Nutrition.  Exercise.  Lifestyle changes.  Medicines or insulin, if needed.  Support from your doctors and others.  How do I manage my blood sugar?  Check your blood sugar level every day, as often as told.  Call your doctor if your blood sugar is above your goal numbers for 2 tests in a row.  Have your A1c (hemoglobin A1c) level checked at least two times a year. Have it checked more often if your doctor tells you to. Your doctor will set treatment goals for you. Generally, you should have these blood sugar levels:  Before meals (preprandial): 80-130 mg/dL (4.4-7.2 mmol/L).  After meals (postprandial): lower than 180 mg/dL (10 mmol/L).  A1c level: less than 7%.  What do I need to know about high blood sugar? High blood sugar is called hyperglycemia. Know the signs of high blood sugar. Signs may include:  Feeling: ? Thirsty. ? Hungry. ? Very tired.  Needing to pee (urinate) more than usual.  Blurry vision.  What do I need to know about low blood sugar? Low blood sugar is called hypoglycemia. This is when blood sugar is at or below 70 mg/dL (3.9 mmol/L). Symptoms may include:  Feeling: ? Hungry. ? Worried or nervous (anxious). ? Sweaty and clammy. ? Confused. ? Dizzy. ? Sleepy. ? Sick to your stomach (nauseous).  Having: ? A fast heartbeat (palpitations). ? A headache. ? A change in your vision. ? Jerky movements that you cannot control (seizure). ? Nightmares. ? Tingling or no feeling (numbness) around the mouth, lips, or tongue.  Having trouble with: ? Talking. ? Paying attention (concentrating). ? Moving (coordination). ? Sleeping.  Shaking.  Passing out (fainting).  Getting upset easily (irritability).  Treating low blood sugar  To treat low blood sugar, eat or  drink something sugary right away. If you can think clearly and swallow safely, follow the 15:15 rule:  Take 15 grams of a fast-acting carb (carbohydrate). Some fast-acting carbs are: ? 1 tube of glucose gel. ? 3 sugar tablets (glucose pills). ? 6-8 pieces of hard candy. ? 4 oz (120 mL) of fruit juice. ? 4 oz (120 mL) regular (not diet) soda.  Check your blood sugar 15 minutes after you take the carb.  If your blood sugar is still at or below 70 mg/dL (3.9 mmol/L), take 15 grams of a carb again.  If your blood sugar does not go above 70 mg/dL (3.9 mmol/L) after 3 tries, get help right away.  After your blood sugar goes back to normal, eat a meal or a snack within 1 hour.  Treating very low blood sugar If your blood sugar is at or below 54 mg/dL (3 mmol/L), you have very low blood sugar (severe hypoglycemia). This is an emergency. Do not wait to see if the symptoms will go away. Get medical help right away. Call your local emergency services (911 in the [place] Do not drive yourself to the hospital. If you have very low blood sugar and you cannot eat or drink, you may need a glucagon shot (injection). A family member or friend should learn how to check your blood sugar and how to give you a glucagon shot. Ask your doctor if you need to have a glucagon shot kit at home. What else is important to manage my diabetes? Medicine  Follow these instructions about insulin and diabetes medicines: °· Take them as told by your doctor. °· Adjust them as told by your doctor. °· Do not run out of them. ° °Having diabetes can raise your risk for other long-term conditions. These include heart or kidney disease. Your doctor may prescribe medicines to help prevent problems from diabetes. °Food ° °· Make healthy food choices. These include: °? Chicken, fish, egg whites, and beans. °? Oats, whole wheat, bulgur, brown rice, quinoa, and millet. °? Fresh fruits and vegetables. °? Low-fat dairy products. °? Nuts,  avocado, olive oil, and canola oil. °· Make a food plan with a specialist (dietitian). °· Follow instructions from your doctor about what you cannot eat or drink. °· Drink enough fluid to keep your pee (urine) clear or pale yellow. °· Eat healthy snacks between healthy meals. °· Keep track of carbs that you eat. Read food labels. Learn food serving sizes. °· Follow your sick day plan when you cannot eat or drink normally. Make this plan with your doctor so it is ready to use. °Activity °· Exercise at least 3 times a week. °· Do not go more than 2 days without exercising. °· Talk with your doctor before you start a new exercise. Your doctor may need to adjust your insulin, medicines, or food. °Lifestyle ° °· Do not use any tobacco products. These include cigarettes, chewing tobacco, and e-cigarettes.If you need help quitting, ask your doctor. °· Ask your doctor how much alcohol is safe for you. °· Learn to deal with stress. If you need help with this, ask your doctor. °Body care °· Stay up to date with your shots (immunizations). °· Have your eyes and feet checked by a doctor as often as told. °· Check your skin and feet every day. Check for cuts, bruises, redness, blisters, or sores. °· Brush your teeth and gums two times a day. °· Floss at least one time a day. °· Go to the dentist least one time every 6 months. °· Stay at a healthy weight. °General instructions ° °· Take over-the-counter and prescription medicines only as told by your doctor. °· Share your diabetes care plan with: °? Your work or school. °? People you live with. °· Check your pee (urine) for ketones: °? When you are sick. °? As told by your doctor. °· Carry a card or wear jewelry that says that you have diabetes. °· Ask your doctor: °? Do I need to meet with a diabetes educator? °? Where can I find a support group for people with diabetes? °· Keep all follow-up visits as told by your doctor. This is important. °Where to find more information: °To  learn more about diabetes, visit: °· American Diabetes Association: www.diabetes.org °· American Association of Diabetes Educators: www.diabeteseducator.org/patient-resources ° °This information is not intended to replace advice given to you by your health care provider. Make sure you discuss any questions you have with your health care provider. °Document Released: 08/24/2015 Document Revised: 10/08/2015 Document Reviewed: 06/05/2015 °Elsevier Interactive Patient Education © 2018 [employer] °Diabetes Mellitus and Nutrition °When you have diabetes (diabetes mellitus), it is very important to have healthy eating habits because your blood sugar (glucose) levels are greatly affected by what you eat and drink. Eating healthy foods in the appropriate amounts, at about the same times every day, can help you: °· Control your blood glucose. °· Lower your risk of heart disease. °· Improve your blood pressure. °· Reach or maintain a healthy weight. ° °  Every person with diabetes is different, and each person has different needs for a meal plan. Your health care provider may recommend that you work with a diet and nutrition specialist (dietitian) to make a meal plan that is best for you. Your meal plan may vary depending on factors such as: °· The calories you need. °· The medicines you take. °· Your weight. °· Your blood glucose, blood pressure, and cholesterol levels. °· Your activity level. °· Other health conditions you have, such as heart or kidney disease. ° °How do carbohydrates affect me? °Carbohydrates affect your blood glucose level more than any other type of food. Eating carbohydrates naturally increases the amount of glucose in your blood. Carbohydrate counting is a method for keeping track of how many carbohydrates you eat. Counting carbohydrates is important to keep your blood glucose at a healthy level, especially if you use insulin or take certain oral diabetes medicines. °It is important to know how many  carbohydrates you can safely have in each meal. This is different for every person. Your dietitian can help you calculate how many carbohydrates you should have at each meal and for snack. °Foods that contain carbohydrates include: °· Bread, cereal, rice, pasta, and crackers. °· Potatoes and corn. °· Peas, beans, and lentils. °· Milk and yogurt. °· Fruit and juice. °· Desserts, such as cakes, cookies, ice cream, and candy. ° °How does alcohol affect me? °Alcohol can cause a sudden decrease in blood glucose (hypoglycemia), especially if you use insulin or take certain oral diabetes medicines. Hypoglycemia can be a life-threatening condition. Symptoms of hypoglycemia (sleepiness, dizziness, and confusion) are similar to symptoms of having too much alcohol. °If your health care provider says that alcohol is safe for you, follow these guidelines: °· Limit alcohol intake to no more than 1 drink per day for nonpregnant women and 2 drinks per day for men. One drink equals 12 oz of beer, 5 oz of wine, or 1½ oz of hard liquor. °· Do not drink on an empty stomach. °· Keep yourself hydrated with water, diet soda, or unsweetened iced tea. °· Keep in mind that regular soda, juice, and other mixers may contain a lot of sugar and must be counted as carbohydrates. ° °What are tips for following this plan? °Reading food labels °· Start by checking the serving size on the label. The amount of calories, carbohydrates, fats, and other nutrients listed on the label are based on one serving of the food. Many foods contain more than one serving per package. °· Check the total grams (g) of carbohydrates in one serving. You can calculate the number of servings of carbohydrates in one serving by dividing the total carbohydrates by 15. For example, if a food has 30 g of total carbohydrates, it would be equal to 2 servings of carbohydrates. °· Check the number of grams (g) of saturated and trans fats in one serving. Choose foods that have low  or no amount of these fats. °· Check the number of milligrams (mg) of sodium in one serving. Most people should limit total sodium intake to less than 2,300 mg per day. °· Always check the nutrition information of foods labeled as "low-fat" or "nonfat". These foods may be higher in added sugar or refined carbohydrates and should be avoided. °· Talk to your dietitian to identify your daily goals for nutrients listed on the label. °Shopping °· Avoid buying canned, premade, or processed foods. These foods tend to be high in fat, sodium, and   added sugar. °· Shop around the outside edge of the grocery store. This includes fresh fruits and vegetables, bulk grains, fresh meats, and fresh dairy. °Cooking °· Use low-heat cooking methods, such as baking, instead of high-heat cooking methods like deep frying. °· Cook using healthy oils, such as olive, canola, or sunflower oil. °· Avoid cooking with butter, cream, or high-fat meats. °Meal planning °· Eat meals and snacks regularly, preferably at the same times every day. Avoid going long periods of time without eating. °· Eat foods high in fiber, such as fresh fruits, vegetables, beans, and whole grains. Talk to your dietitian about how many servings of carbohydrates you can eat at each meal. °· Eat 4-6 ounces of lean protein each day, such as lean meat, chicken, fish, eggs, or tofu. 1 ounce is equal to 1 ounce of meat, chicken, or fish, 1 egg, or 1/4 cup of tofu. °· Eat some foods each day that contain healthy fats, such as avocado, nuts, seeds, and fish. °Lifestyle ° °· Check your blood glucose regularly. °· Exercise at least 30 minutes 5 or more days each week, or as told by your health care provider. °· Take medicines as told by your health care provider. °· Do not use any products that contain nicotine or tobacco, such as cigarettes and e-cigarettes. If you need help quitting, ask your health care provider. °· Work with a counselor or diabetes educator to identify  strategies to manage stress and any emotional and social challenges. °What are some questions to ask my health care provider? °· Do I need to meet with a diabetes educator? °· Do I need to meet with a dietitian? °· What number can I call if I have questions? °· When are the best times to check my blood glucose? °Where to find more information: °· American Diabetes Association: diabetes.org/food-and-fitness/food °· Academy of Nutrition and Dietetics: www.eatright.org/resources/health/diseases-and-conditions/diabetes °· National Institute of Diabetes and Digestive and Kidney Diseases (NIH): www.niddk.nih.gov/health-information/diabetes/overview/diet-eating-physical-activity °Summary °· A healthy meal plan will help you control your blood glucose and maintain a healthy lifestyle. °· Working with a diet and nutrition specialist (dietitian) can help you make a meal plan that is best for you. °· Keep in mind that carbohydrates and alcohol have immediate effects on your blood glucose levels. It is important to count carbohydrates and to use alcohol carefully. °This information is not intended to replace advice given to you by your health care provider. Make sure you discuss any questions you have with your health care provider. °Document Released: 01/27/2005 Document Revised: 06/06/2016 Document Reviewed: 06/06/2016 °Elsevier Interactive Patient Education © 2018 [employer] ° °

## 2017-06-08 NOTE — Progress Notes (Signed)
Subjective:  Patient ID: Shawn Patrick, male    DOB: [birth year]  Age: 47 y.o. MRN: [id number]  CC: Hospitalization Follow-up   HPI Shawn Patrick is a 47 year male who presents to establish care and hospitalization follow up. Recent history of hospitalization 05/30/17-06/03/17  and ED visit on 06/07/17 related to scrotal abscess. He presented on 05/30/17 with c/o of worsening pain and swelling of the scrotal area with small mass in the left groin area for about 1 week. CT pelvis done which showed phlegmon. Scrotal cellulitis with abscess. Urology Dr.Manny  was consulted IV antibiotics and I&D performed on scrotum 06/02/17. HH RN was ordered for discharge for wound care. During hospital admit he was treated with BiPAP for acute hypercarbic respiratory failure. Sleep study recommended outpatient for CPAP evaluation. He was also found to be diabetic during hospitalization with HgbA1c 13.5. DM coordinator was consulted. He presented to ED on 06/07/17 for difficulty with packing wound. He reported adherence with antibiotic use and daily packing as recommended. Wound was packed in the ED with 4 inches of packing. He is currently on antibiotic therapy. It was recommend he f/u with urologist. He presents today for diabetes f/u. Diabetes Mellitus: Patient presents for follow up of diabetes. History of newly diagnosed diabetes. Diagnosed during hospitalization. Symptoms: paresthesia of the feet and visual disturbances. Onset 2 years ago . Patient denies foot ulcerations, nausea and vomitting.  Evaluation to date has been included: fasting blood sugar and hemoglobin A1C.  Home sugars: He reports checking hist CBG's six times a day. CBG's range 80's-160's. Treatment to date: insulin.    Outpatient Medications Prior to Visit  Medication Sig Dispense Refill  . acetaminophen (TYLENOL) 500 MG tablet Take 500 mg by mouth every 6 (six) hours as needed (for pain or headaches).     . blood glucose meter kit and supplies  Dispense based on patient and insurance preference. Use up to four times daily as directed. (FOR ICD-10 E10.9, E11.9). 1 each 0  . Ibuprofen-Diphenhydramine HCl (ADVIL PM) 200-25 MG CAPS Take 1-2 capsules by mouth at bedtime as needed (for sleep).    . Insulin Syringe-Needle U-100 (INSULIN SYRINGE .3CC/31GX5/16") 31G X 5/16" 0.3 ML MISC Use up to 4 times daily with insulin 120 each 0  . naproxen sodium (ALEVE) 220 MG tablet Take 220-440 mg by mouth daily as needed (for pain or headaches).    . nicotine (NICODERM CQ - DOSED IN MG/24 HOURS) 14 mg/24hr patch Place 1 patch (14 mg total) onto the skin daily. 28 patch 0  . sulfamethoxazole-trimethoprim (BACTRIM DS,SEPTRA DS) 800-160 MG tablet Take 2 tablets by mouth every 12 (twelve) hours for 10 days. 40 tablet 0  . insulin aspart (NOVOLOG) 100 UNIT/ML injection Sliding scale: 121 - 150: 1 units, 151 - 200: 2 units, 201 - 250: 3 units, 251 - 300: 5 units, 301 - 350: 7 units, 351 - 400: 9 units 30 mL 0  . insulin glargine (LANTUS) 100 UNIT/ML injection Inject 0.25 mLs (25 Units total) into the skin at bedtime. 10 mL 0   No facility-administered medications prior to visit.     ROS Review of Systems  Constitutional: Negative.   Eyes: Positive for visual disturbance.  Respiratory: Negative.   Cardiovascular: Negative.   Gastrointestinal: Negative.   Genitourinary: Negative for difficulty urinating and hematuria.  Skin: Negative.   Neurological:       Feet parathesias  Psychiatric/Behavioral: Negative for suicidal ideas.   Objective:  BP 111/76 (  BP Location: Left Arm, Patient Position: Sitting, Cuff Size: Large)   Pulse 86   Temp 98.4 F (36.9 C) (Oral)   Resp 18   Ht 5' 10" (1.778 m)   Wt 276 lb (125.2 kg)   SpO2 95%   BMI 39.60 kg/m   BP/Weight 06/08/2017 06/07/2017 06/03/2017  Systolic BP 111 136 145  Diastolic BP 76 75 86  Wt. (Lbs) 276 - 287.3  BMI 39.6 - 41.22     Physical Exam  Constitutional: He appears well-developed and  well-nourished.  Eyes: Conjunctivae are normal. Pupils are equal, round, and reactive to light.  Neck: Normal range of motion. Neck supple. No JVD present.  Cardiovascular: Normal rate, regular rhythm, normal heart sounds and intact distal pulses.  Pulmonary/Chest: Effort normal and breath sounds normal.  Abdominal: Soft. Bowel sounds are normal. There is no tenderness.  Skin: Skin is warm and dry.  Psychiatric: His affect is not inappropriate. He expresses no homicidal and no suicidal ideation. He expresses no suicidal plans and no homicidal plans. He is communicative. He is attentive.  Nursing note and vitals reviewed.  Diabetic Foot Exam - Simple   Simple Foot Form Diabetic Foot exam was performed with the following findings:  Yes 06/08/2017  5:00 PM  Visual Inspection No deformities, no ulcerations, no other skin breakdown bilaterally:  Yes Sensation Testing Intact to touch and monofilament testing bilaterally:  Yes Pulse Check Posterior Tibialis and Dorsalis pulse intact bilaterally:  Yes Comments      Assessment & Plan:   1. Newly diagnosed diabetes (HCC) Dietary and exercise interventions dicussed in detail. Continue current medication at time of hospital discharge. Check CBG ACHS. Start checking CBG's . Bring glucometer or blood glucose log to next office visit. Will refer to opthalmology. - insulin aspart (NOVOLOG) 100 UNIT/ML injection; Sliding scale: 121 - 150: 1 units, 151 - 200: 2 units, 201 - 250: 3 units, 251 - 300: 5 units, 301 - 350: 7 units, 351 - 400: 9 units  Dispense: 30 mL; Refill: 5 - insulin glargine (LANTUS) 100 UNIT/ML injection; Inject 0.25 mLs (25 Units total) into the skin at bedtime.  Dispense: 20 mL; Refill: 4 - Lipid Panel  2. Diabetic polyneuropathy associated with type 2 diabetes mellitus (HCC)  - gabapentin (NEURONTIN) 300 MG capsule; Take 1 capsule (300 mg total) by mouth at bedtime.  Dispense: 30 capsule; Refill: 2  3. Scrotal abscess  -  Ambulatory referral to Urology  4. Snoring  - Nocturnal polysomnography (NPSG); Future  5. Daytime sleepiness  - Nocturnal polysomnography (NPSG); Future  6. Fatigue, unspecified type  - Nocturnal polysomnography (NPSG); Future    Follow-up: Return in about 1 month (around 07/09/2017) for DM.   Mandesia R Hairston FNP    

## 2017-06-09 ENCOUNTER — Other Ambulatory Visit: Payer: Self-pay | Admitting: Family Medicine

## 2017-06-09 DIAGNOSIS — E782 Mixed hyperlipidemia: Secondary | ICD-10-CM

## 2017-06-09 LAB — LIPID PANEL
Chol/HDL Ratio: 10.2 ratio — ABNORMAL HIGH (ref 0.0–5.0)
Cholesterol, Total: 297 mg/dL — ABNORMAL HIGH (ref 100–199)
HDL: 29 mg/dL — ABNORMAL LOW
LDL Calculated: 210 mg/dL — ABNORMAL HIGH (ref 0–99)
Triglycerides: 289 mg/dL — ABNORMAL HIGH (ref 0–149)
VLDL Cholesterol Cal: 58 mg/dL — ABNORMAL HIGH (ref 5–40)

## 2017-06-09 MED ORDER — ATORVASTATIN CALCIUM 20 MG PO TABS
20.0000 mg | ORAL_TABLET | Freq: Every day | ORAL | 2 refills | Status: DC
Start: 2017-06-09 — End: 2019-04-08

## 2017-06-09 MED FILL — ?ATORVASTATIN 20MG TABLET: 20 | 30 days supply | Qty: 30 | Fill #0

## 2017-06-12 ENCOUNTER — Other Ambulatory Visit: Payer: Self-pay | Admitting: Family Medicine

## 2017-06-12 DIAGNOSIS — E119 Type 2 diabetes mellitus without complications: Secondary | ICD-10-CM

## 2017-06-21 ENCOUNTER — Ambulatory Visit: Payer: Self-pay

## 2017-06-26 ENCOUNTER — Other Ambulatory Visit: Payer: Self-pay | Admitting: Family Medicine

## 2017-06-26 ENCOUNTER — Encounter (HOSPITAL_BASED_OUTPATIENT_CLINIC_OR_DEPARTMENT_OTHER): Payer: Self-pay

## 2017-06-26 ENCOUNTER — Telehealth: Payer: Self-pay | Admitting: Internal Medicine

## 2017-06-26 ENCOUNTER — Ambulatory Visit: Payer: Self-pay | Attending: Internal Medicine

## 2017-06-26 DIAGNOSIS — E119 Type 2 diabetes mellitus without complications: Secondary | ICD-10-CM

## 2017-06-26 MED ORDER — TRUEPLUS LANCETS 28G MISC: 1.0000 | Freq: Once | 12 refills | Status: AC

## 2017-06-26 MED ORDER — GLUCOSE BLOOD VI STRP: ORAL_STRIP | 12 refills | Status: DC

## 2017-06-26 MED ORDER — TRUE METRIX METER W/DEVICE KIT: 1.0000 | PACK | Freq: Once | 0 refills | Status: AC

## 2017-06-26 MED FILL — TRUEplus LANCETS 28G MISC: 30 days supply | Qty: 100 | Fill #0

## 2017-06-26 MED FILL — TRUE METRIX TEST STRIP: 30 days supply | Qty: 100 | Fill #0

## 2017-06-26 MED FILL — !TRUE METRIX BLOOD GLUCOSE: 30 days supply | Qty: 1 | Fill #0

## 2017-07-10 ENCOUNTER — Ambulatory Visit: Payer: Worker's Compensation | Attending: Family Medicine | Admitting: Internal Medicine

## 2017-07-10 ENCOUNTER — Encounter: Payer: Self-pay | Admitting: Internal Medicine

## 2017-07-10 ENCOUNTER — Ambulatory Visit: Payer: Self-pay | Admitting: Family Medicine

## 2017-07-10 VITALS — BP 120/85 | HR 87 | Temp 98.2°F | Resp 16 | Ht 70.0 in | Wt 274.8 lb

## 2017-07-10 DIAGNOSIS — J9692 Respiratory failure, unspecified with hypercapnia: Secondary | ICD-10-CM | POA: Insufficient documentation

## 2017-07-10 DIAGNOSIS — Z794 Long term (current) use of insulin: Secondary | ICD-10-CM | POA: Insufficient documentation

## 2017-07-10 DIAGNOSIS — E118 Type 2 diabetes mellitus with unspecified complications: Secondary | ICD-10-CM | POA: Insufficient documentation

## 2017-07-10 DIAGNOSIS — E785 Hyperlipidemia, unspecified: Secondary | ICD-10-CM | POA: Insufficient documentation

## 2017-07-10 DIAGNOSIS — E11319 Type 2 diabetes mellitus with unspecified diabetic retinopathy without macular edema: Secondary | ICD-10-CM | POA: Insufficient documentation

## 2017-07-10 DIAGNOSIS — Z79899 Other long term (current) drug therapy: Secondary | ICD-10-CM | POA: Insufficient documentation

## 2017-07-10 DIAGNOSIS — R0683 Snoring: Secondary | ICD-10-CM | POA: Insufficient documentation

## 2017-07-10 DIAGNOSIS — F17210 Nicotine dependence, cigarettes, uncomplicated: Secondary | ICD-10-CM | POA: Insufficient documentation

## 2017-07-10 DIAGNOSIS — E669 Obesity, unspecified: Secondary | ICD-10-CM | POA: Insufficient documentation

## 2017-07-10 DIAGNOSIS — Z6839 Body mass index (BMI) 39.0-39.9, adult: Secondary | ICD-10-CM | POA: Insufficient documentation

## 2017-07-10 LAB — GLUCOSE, POCT (MANUAL RESULT ENTRY): POC Glucose: 153 mg/dl — AB (ref 70–99)

## 2017-07-10 MED ORDER — METFORMIN HCL 500 MG PO TABS: 500.0000 mg | ORAL_TABLET | Freq: Every day | ORAL | 3 refills | Status: DC

## 2017-07-10 NOTE — Progress Notes (Signed)
Patient ID: Shawn Patrick, male    DOB: [birth year]  MRN: [id number]  CC: re-establish and Diabetes   Subjective: Shawn Patrick is a 47 y.o. male who presents chronic ds management and est care with me as PCP.  He saw the nurse practitioner on new patient visit 1 month ago. His concerns today include:  Pt with DM with retinopathy, scrotal abscess, tob dep, hypercarbic resp failure  1.  DM:  BS: checking BS 4 x a day.  Does not have log with him.  Highest was 146. He has not been taking Lantus or Novolog in past 2 wks. Eating Habits:  "I've completely change my eating."  Use to eat a lot of junk foods at nights and 6-12 sodas a day.  "I don't eat any of that stuff any more." -tingling in feet.  Did not tolerate Gabapentin. Makes him too drowsy the following day -had eye exam done less than 1 yr ago.  Wears prescription glasses  2.  Hypercarbic Resp failure: applied for [facility] discount and OC. Has to bring in additional info for the financial specialist.  He has been referred for sleep study  HM: due for Tdap but wants to wait until he gets OC/[facility] Patient Active Problem List   Diagnosis Date Noted  . Cellulitis of scrotum 05/31/2017  . Hyperglycemia 05/31/2017  . Cellulitis 05/31/2017  . Cellulitis, scrotum 05/31/2017     Current Outpatient Medications on File Prior to Visit  Medication Sig Dispense Refill  . acetaminophen (TYLENOL) 500 MG tablet Take 500 mg by mouth every 6 (six) hours as needed (for pain or headaches).     Marland Kitchen atorvastatin (LIPITOR) 20 MG tablet Take 1 tablet (20 mg total) by mouth daily. 30 tablet 2  . blood glucose meter kit and supplies Dispense based on patient and insurance preference. Use up to four times daily as directed. (FOR ICD-10 E10.9, E11.9). 1 each 0  . gabapentin (NEURONTIN) 300 MG capsule Take 1 capsule (300 mg total) by mouth at bedtime. 30 capsule 2  . glucose blood test strip Use as instructed 100 each 12  . Ibuprofen-Diphenhydramine HCl  (ADVIL PM) 200-25 MG CAPS Take 1-2 capsules by mouth at bedtime as needed (for sleep).    . Insulin Syringe-Needle U-100 (INSULIN SYRINGE .3CC/31GX5/16") 31G X 5/16" 0.3 ML MISC Use up to 4 times daily with insulin 120 each 0  . naproxen sodium (ALEVE) 220 MG tablet Take 220-440 mg by mouth daily as needed (for pain or headaches).    . nicotine (NICODERM CQ - DOSED IN MG/24 HOURS) 14 mg/24hr patch Place 1 patch (14 mg total) onto the skin daily. 28 patch 0   No current facility-administered medications on file prior to visit.     No Known Allergies  Social History   Socioeconomic History  . Marital status: Single    Spouse name: Not on file  . Number of children: Not on file  . Years of education: Not on file  . Highest education level: Not on file  Social Needs  . Financial resource strain: Not on file  . Food insecurity - worry: Not on file  . Food insecurity - inability: Not on file  . Transportation needs - medical: Not on file  . Transportation needs - non-medical: Not on file  Occupational History  . Not on file  Tobacco Use  . Smoking status: Current Every Day Smoker    Packs/day: 1.00    Types: Cigarettes  .  Smokeless tobacco: Never Used  Substance and Sexual Activity  . Alcohol use: No  . Drug use: No    Comment: h/o smoking marijuana quit  . Sexual activity: Not on file  Other Topics Concern  . Not on file  Social History Narrative  . Not on file    Family History  Problem Relation Age of Onset  . Hypertension Other     No past surgical history on file.  ROS: Review of Systems Negative except as stated above   PHYSICAL EXAM: BP 120/85   Pulse 87   Temp 98.2 F (36.8 C) (Oral)   Resp 16   Ht 5' 10"  (1.778 m)   Wt 274 lb 12.8 oz (124.6 kg)   SpO2 95%   BMI 39.43 kg/m   Wt Readings from Last 3 Encounters:  07/10/17 274 lb 12.8 oz (124.6 kg)  06/08/17 276 lb (125.2 kg)  06/03/17 287 lb 4.8 oz (130.3 kg)    Physical Exam  General appearance  - alert, well appearing, and in no distress Mental status - alert, oriented to person, place, and time, normal mood, behavior, speech, dress, motor activity, and thought processes Chest - clear to auscultation, no wheezes, rales or rhonchi, symmetric air entry Heart - normal rate, regular rhythm, normal S1, S2, no murmurs, rubs, clicks or gallops  Results for orders placed or performed in visit on 07/10/17  POCT glucose (manual entry)  Result Value Ref Range   POC Glucose 153 (A) 70 - 99 mg/dl   BS 153 Lab Results  Component Value Date   HGBA1C 13.5 (H) 05/30/2017     ASSESSMENT AND PLAN: 1. Controlled type 2 diabetes mellitus with complication, without long-term current use of insulin (HCC) Blood sugars have come under very good control with change in eating habits.  He has not had to take insulin. Stop NovoLog and Lantus.  Start low-dose metformin Continue healthy eating habits.  Encourage him to get in some regular aerobic exercise at least 3-4 days a week for 30 minutes - POCT glucose (manual entry) - Microalbumin / creatinine urine ratio - metFORMIN (GLUCOPHAGE) 500 MG tablet; Take 1 tablet (500 mg total) by mouth daily with breakfast.  Dispense: 90 tablet; Refill: 3  2. Hyperlipidemia, unspecified hyperlipidemia type Continue Lipitor 3. Snoring Referred for sleep study 4. Obesity (BMI 35.0-39.9 without comorbidity) See #1 above   Patient was given the opportunity to ask questions.  Patient verbalized understanding of the plan and was able to repeat key elements of the plan.   Orders Placed This Encounter  Procedures  . Microalbumin / creatinine urine ratio  . POCT glucose (manual entry)     Requested Prescriptions   Signed Prescriptions Disp Refills  . metFORMIN (GLUCOPHAGE) 500 MG tablet 90 tablet 3    Sig: Take 1 tablet (500 mg total) by mouth daily with breakfast.    Return in about 3 months (around 10/07/2017).  Karle Plumber, MD, FACP

## 2017-07-10 NOTE — Patient Instructions (Addendum)
Stop Insulin Start Metformin 500 mg once a day instead.  Continue to monitor blood sugars at least twice a day for the next 2 weeks.  If blood sugars remain good, you can then decrease blood sugar checks to once a day.  Let me know once you have qualified for the [facility] discount or orange card.

## 2017-07-11 LAB — MICROALBUMIN / CREATININE URINE RATIO
Creatinine, Urine: 177.2 mg/dL
Microalb/Creat Ratio: 85.7 mg/g creat — ABNORMAL HIGH (ref 0.0–30.0)
Microalbumin, Urine: 151.8 ug/mL

## 2017-07-13 ENCOUNTER — Telehealth: Payer: Self-pay

## 2017-07-13 NOTE — Telephone Encounter (Signed)
Contacted pt to go over urine results pt is aware and doesn't have any questions or concerns  

## 2017-08-04 ENCOUNTER — Encounter (HOSPITAL_COMMUNITY): Payer: Self-pay | Admitting: Emergency Medicine

## 2017-08-04 ENCOUNTER — Ambulatory Visit (HOSPITAL_COMMUNITY)
Admission: EM | Admit: 2017-08-04 | Discharge: 2017-08-04 | Disposition: A | Discharge status: Home / Self Care | Payer: Self-pay | Attending: Family Medicine | Admitting: Family Medicine

## 2017-08-04 ENCOUNTER — Other Ambulatory Visit: Payer: Self-pay

## 2017-08-04 DIAGNOSIS — K047 Periapical abscess without sinus: Secondary | ICD-10-CM

## 2017-08-04 HISTORY — DX: Type 2 diabetes mellitus without complications: E11.9

## 2017-08-04 MED ORDER — PENICILLIN V POTASSIUM 500 MG PO TABS: 500.0000 mg | ORAL_TABLET | Freq: Three times a day (TID) | ORAL | 1 refills | Status: DC

## 2017-08-04 NOTE — ED Triage Notes (Signed)
C/o toothache with letf side facial swelling onset 3 days

## 2017-08-04 NOTE — ED Provider Notes (Signed)
[facility]   [id number] 08/04/17 Arrival Time: 1744   SUBJECTIVE:  Shawn Patrick is a 47 y.o. male who presents to the urgent care with complaint of oral swelling and dental pressure tooth number 20.  All teeth need to be pulled.  Works in Aeronautical engineer   Past Medical History:  Diagnosis Date  . Diabetes mellitus without complication (HCC)   . Hypertension   . Obesity    Family History  Problem Relation Age of Onset  . Hypertension Other    Social History   Socioeconomic History  . Marital status: Single    Spouse name: Not on file  . Number of children: Not on file  . Years of education: Not on file  . Highest education level: Not on file  Occupational History  . Not on file  Social Needs  . Financial resource strain: Not on file  . Food insecurity:    Worry: Not on file    Inability: Not on file  . Transportation needs:    Medical: Not on file    Non-medical: Not on file  Tobacco Use  . Smoking status: Current Every Day Smoker    Packs/day: 1.00    Types: Cigarettes  . Smokeless tobacco: Never Used  Substance and Sexual Activity  . Alcohol use: No  . Drug use: No    Comment: h/o smoking marijuana quit  . Sexual activity: Not on file  Lifestyle  . Physical activity:    Days per week: Not on file    Minutes per session: Not on file  . Stress: Not on file  Relationships  . Social connections:    Talks on phone: Not on file    Gets together: Not on file    Attends religious service: Not on file    Active member of club or organization: Not on file    Attends meetings of clubs or organizations: Not on file    Relationship status: Not on file  . Intimate partner violence:    Fear of current or ex partner: Not on file    Emotionally abused: Not on file    Physically abused: Not on file    Forced sexual activity: Not on file  Other Topics Concern  . Not on file  Social History Narrative  . Not on file   Current Meds  Medication Sig  .  atorvastatin (LIPITOR) 20 MG tablet Take 1 tablet (20 mg total) by mouth daily.  Marland Kitchen gabapentin (NEURONTIN) 300 MG capsule Take 1 capsule (300 mg total) by mouth at bedtime.   No Known Allergies    ROS: As per HPI, remainder of ROS negative.   OBJECTIVE:   Vitals:   08/04/17 1756  BP: 135/79  Pulse: 83  Temp: 98.3 F (36.8 C)  TempSrc: Oral  SpO2: 95%     General appearance: alert; no distress Eyes: PERRL; EOMI; conjunctiva normal HENT: normocephalic; atraumatic;  oral mucosa swollen at tooth #20.  All teeth are carious. Neck: supple. No adenopathy Extremities: no cyanosis or edema; symmetrical with no gross deformities Skin: warm and dry Neurologic: normal gait; grossly normal Psychological: alert and cooperative; normal mood and affect      Labs:  Results for orders placed or performed in visit on 07/10/17  Microalbumin / creatinine urine ratio  Result Value Ref Range   Creatinine, Urine 177.2 Not Estab. mg/dL   Microalbumin, Urine 409.8 Not Estab. ug/mL   Microalb/Creat Ratio 85.7 (H) 0.0 - 30.0 mg/g creat  POCT glucose (manual entry)  Result Value Ref Range   POC Glucose 153 (A) 70 - 99 mg/dl    Labs Reviewed - No data to display  No results found.     ASSESSMENT & PLAN:  1. Dental abscess     Meds ordered this encounter  Medications  . penicillin v potassium (VEETID) 500 MG tablet    Sig: Take 1 tablet (500 mg total) by mouth 3 (three) times daily.    Dispense:  30 tablet    Refill:  1    Reviewed expectations re: course of current medical issues. Questions answered. Outlined signs and symptoms indicating need for more acute intervention. Patient verbalized understanding. After Visit Summary given.    Procedures:      Elvina SidleLauenstein, Richa Shor, MD 08/04/17 1821

## 2017-09-25 IMAGING — CT CT CHEST W/ CM
2 of 4 series · 15 of 36 positions shown, 18 images · IV contrast (iopamidol)
Comparison: 03/17/2014

CLINICAL DATA: Pt fell this past [REDACTED] and landed across a
landscape timber striking his left rib area, now he fells a pain and
stabbing when he breaths

EXAM:
CT CHEST WITH CONTRAST
TECHNIQUE: Multidetector CT imaging of the chest was performed during
intravenous contrast administration.
CONTRAST:  75mL [id number] IOPAMIDOL ([id number]) INJECTION 61%

[Series 4: chest with 3mm st cor · coronal · 0.59mm/px · 3 of 101 slices shown]
[im 21/101  lung]
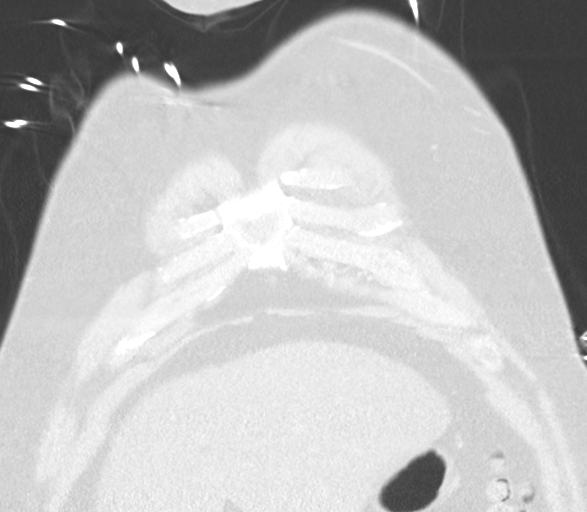
[im 41/101  lung]
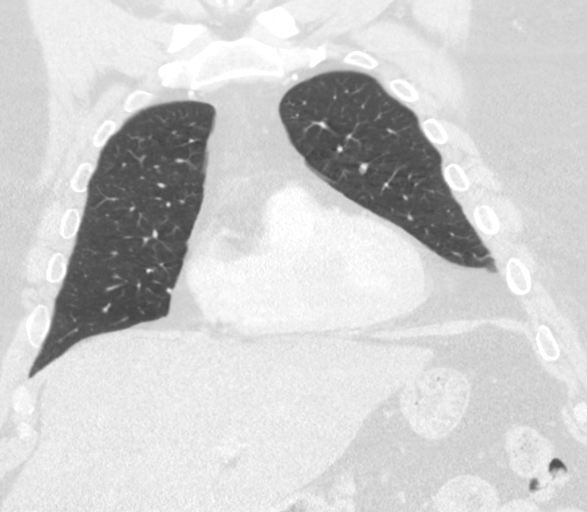
[im 61/101  lung]
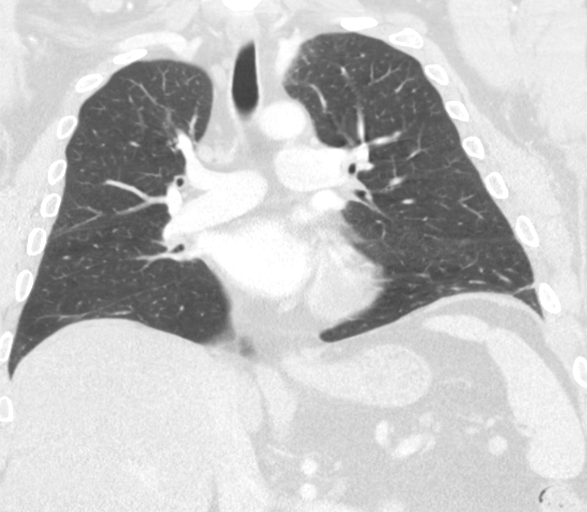

[Series 8: chest with 1mm st · axial · 0.81mm/px · z∈[+1217,+1486]mm · 12 of 376 slices shown, 15 images]
[im 20/376  mediastinal]
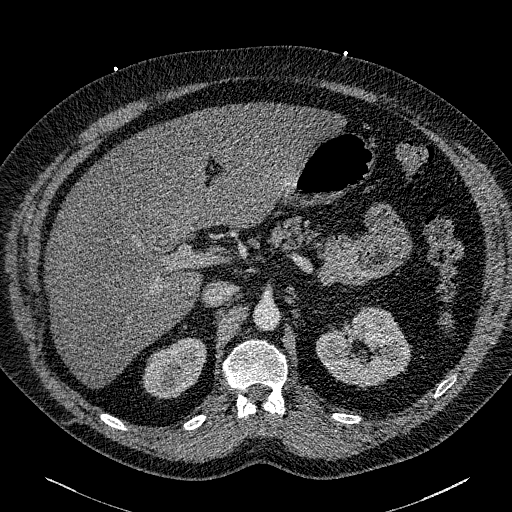
[im 20/376  lung]
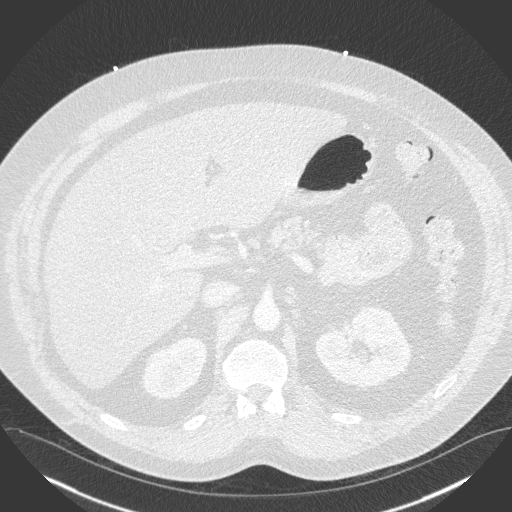
[im 60/376  lung]
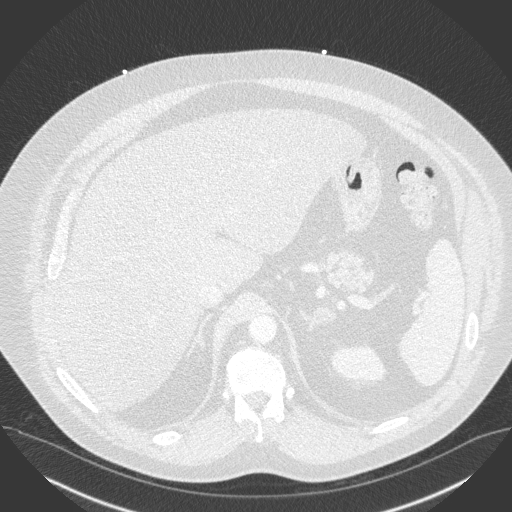
[im 79/376  lung]
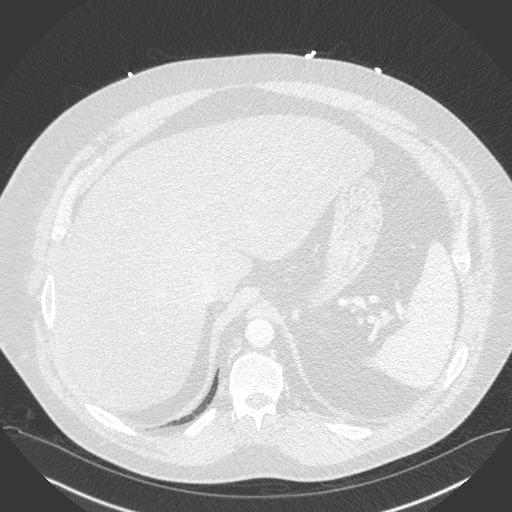
[im 119/376  lung]
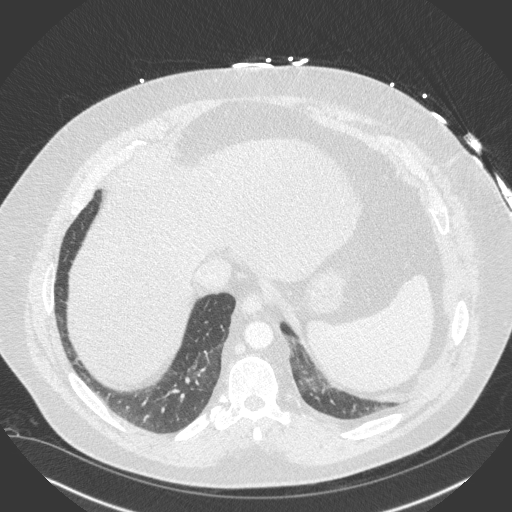
[im 139/376  mediastinal]
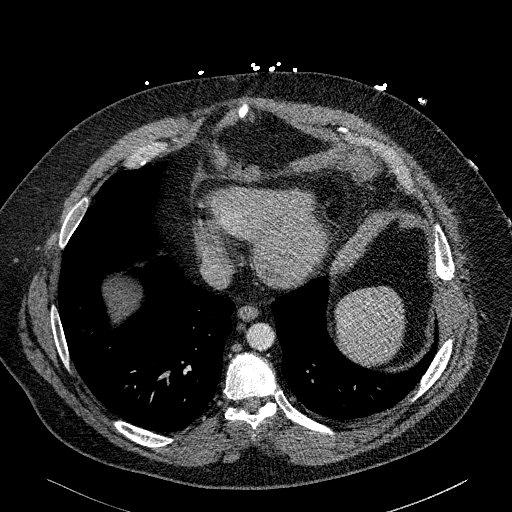
[im 139/376  lung]
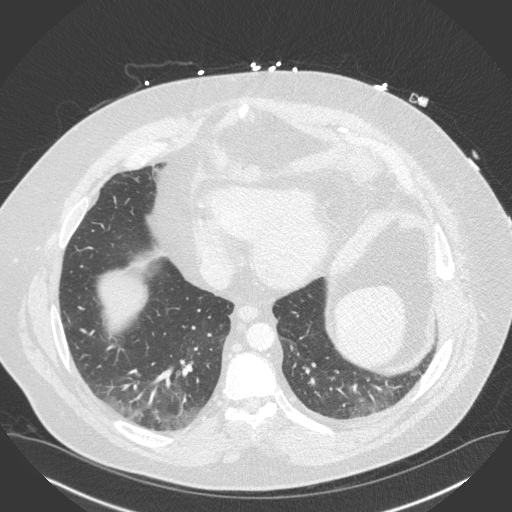
[im 178/376  lung]
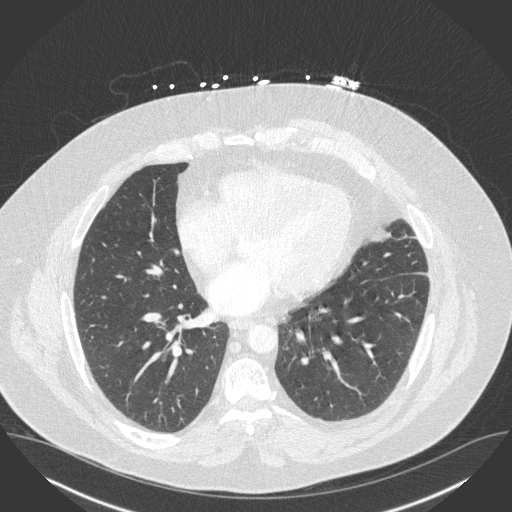
[im 198/376  lung]
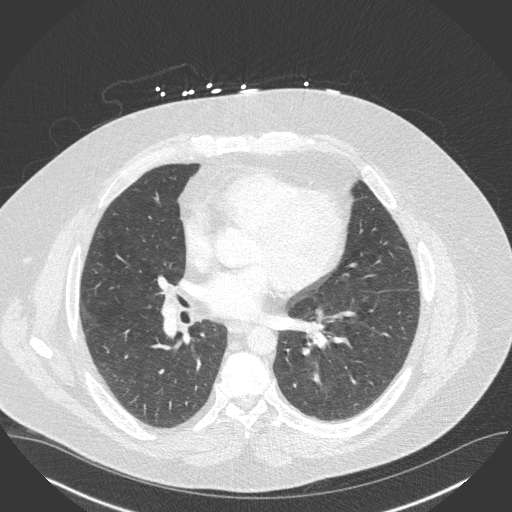
[im 237/376  lung]
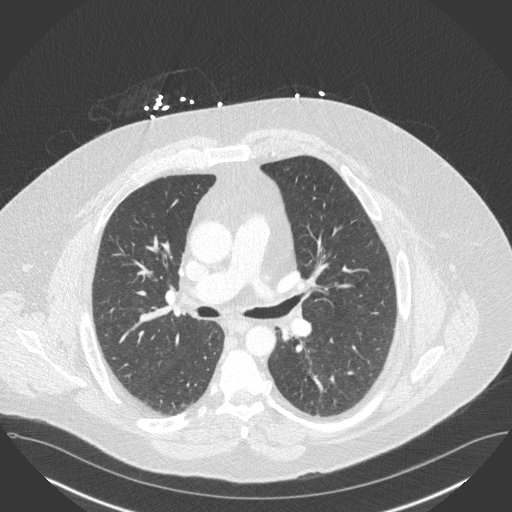
[im 257/376  mediastinal]
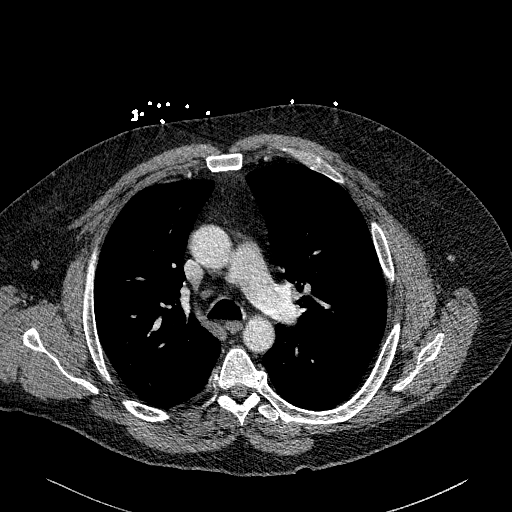
[im 257/376  lung]
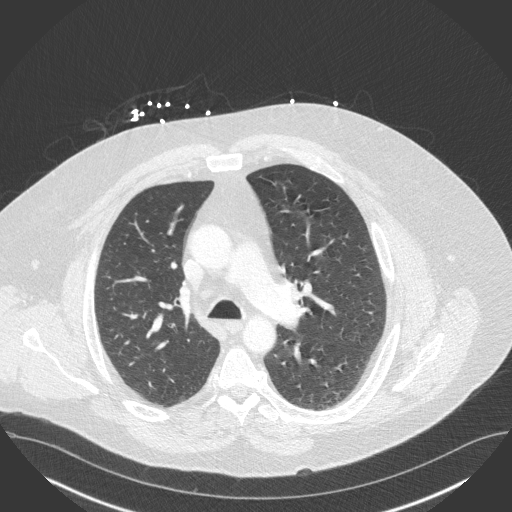
[im 297/376  lung]
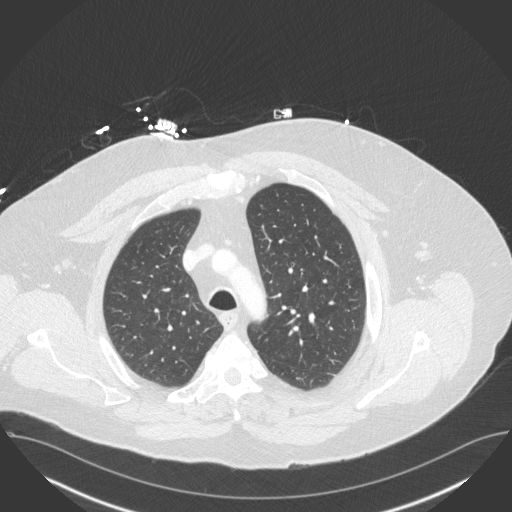
[im 316/376  lung]
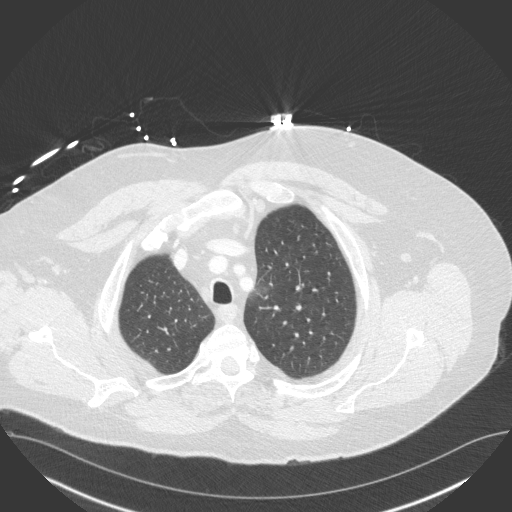
[im 356/376  lung]
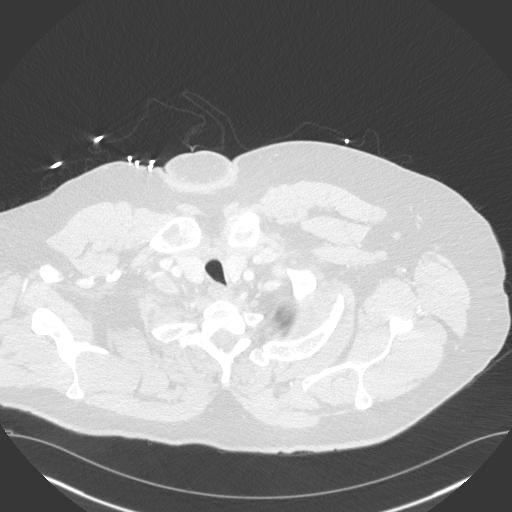

[15 of 36 positions shown; findings below may reference images not displayed]

FINDINGS: Cardiovascular: No significant vascular findings. Normal heart size.
No pericardial effusion.

Mediastinum/Nodes: No enlarged mediastinal, hilar, or axillary lymph
nodes. Thyroid gland, trachea, and esophagus demonstrate no
significant findings.

Lungs/Pleura: Minimal dependent atelectasis posteriorly in both
lower lobes. No focal infiltrate, edema, or nodule. No pneumothorax
or effusion.

Upper Abdomen: Fatty liver. 18 mm left adrenal adenoma, stable. No
acute findings.

Musculoskeletal: Nondisplaced fractures, lateral aspect left sixth
and seventh ribs. Old healed left eighth rib fracture. Anterior
vertebral endplate spurring at multiple levels in the mid and lower
thoracic spine.
IMPRESSION: 1. Nondisplaced fractures, left sixth and seventh ribs, without
pneumothorax or effusion.

## 2017-12-08 ENCOUNTER — Ambulatory Visit: Payer: Self-pay

## 2017-12-08 ENCOUNTER — Other Ambulatory Visit: Payer: Self-pay | Admitting: Occupational Medicine

## 2017-12-08 DIAGNOSIS — M25531 Pain in right wrist: Secondary | ICD-10-CM

## 2018-08-06 ENCOUNTER — Emergency Department (HOSPITAL_COMMUNITY)
Admission: EM | Admit: 2018-08-06 | Discharge: 2018-08-06 | Disposition: A | Discharge status: Home / Self Care | Payer: Self-pay | Attending: Emergency Medicine | Admitting: Emergency Medicine

## 2018-08-06 ENCOUNTER — Other Ambulatory Visit: Payer: Self-pay

## 2018-08-06 DIAGNOSIS — Z7984 Long term (current) use of oral hypoglycemic drugs: Secondary | ICD-10-CM | POA: Insufficient documentation

## 2018-08-06 DIAGNOSIS — I10 Essential (primary) hypertension: Secondary | ICD-10-CM | POA: Insufficient documentation

## 2018-08-06 DIAGNOSIS — K047 Periapical abscess without sinus: Secondary | ICD-10-CM | POA: Insufficient documentation

## 2018-08-06 DIAGNOSIS — F17210 Nicotine dependence, cigarettes, uncomplicated: Secondary | ICD-10-CM | POA: Insufficient documentation

## 2018-08-06 DIAGNOSIS — E119 Type 2 diabetes mellitus without complications: Secondary | ICD-10-CM | POA: Insufficient documentation

## 2018-08-06 LAB — CBG MONITORING, ED: Glucose-Capillary: 88 mg/dL (ref 70–99)

## 2018-08-06 MED ORDER — ACETAMINOPHEN 500 MG PO TABS
1000.0000 mg | ORAL_TABLET | Freq: Once | ORAL | Status: AC
Start: 2018-08-06 — End: 2018-08-06
  Administered 2018-08-06: 1000 mg via ORAL
  Filled 2018-08-06: qty 2

## 2018-08-06 MED ORDER — AMOXICILLIN 500 MG PO CAPS
1000.0000 mg | ORAL_CAPSULE | Freq: Once | ORAL | Status: AC
  Administered 2018-08-06: 1000 mg via ORAL
  Filled 2018-08-06: qty 2

## 2018-08-06 MED ORDER — IBUPROFEN 800 MG PO TABS: 800.0000 mg | ORAL_TABLET | Freq: Four times a day (QID) | ORAL | 0 refills | Status: DC | PRN

## 2018-08-06 MED ORDER — AMOXICILLIN 500 MG PO CAPS: 1000.0000 mg | ORAL_CAPSULE | Freq: Two times a day (BID) | ORAL | 0 refills | Status: DC

## 2018-08-06 MED ORDER — IBUPROFEN 800 MG PO TABS
800.0000 mg | ORAL_TABLET | Freq: Once | ORAL | Status: AC
  Administered 2018-08-06: 800 mg via ORAL
  Filled 2018-08-06: qty 1

## 2018-08-06 NOTE — ED Provider Notes (Signed)
[facility] EMERGENCY DEPARTMENT Provider Note   CSN: [id number] Arrival date & time: 08/06/18  0457    History   Chief Complaint Chief Complaint  Patient presents with  . Dental Pain    HPI Shawn Patrick is a 48 y.o. male.     Patient presents to the emergency department with concerns over dental abscess.  He reports that he has been having a lot of problems with his teeth for the last 3 or 4 years.  Yesterday he started having dental pain and the right upper lateral incisor.  He was using Orajel with minimal relief and then overnight realized that he was starting to have facial swelling on the right side, so he comes to the ER for evaluation.     Past Medical History:  Diagnosis Date  . Diabetes mellitus without complication ([place])   . Hypertension   . Obesity     Patient Active Problem List   Diagnosis Date Noted  . Controlled type 2 diabetes mellitus with complication, without long-term current use of insulin ([place]) 07/10/2017  . Hyperlipidemia 07/10/2017  . Snoring 07/10/2017  . Obesity (BMI 35.0-39.9 without comorbidity) 07/10/2017    No past surgical history on file.      Home Medications    Prior to Admission medications   Medication Sig Start Date End Date Taking? Authorizing Provider  acetaminophen (TYLENOL) 500 MG tablet Take 500 mg by mouth every 6 (six) hours as needed (for pain or headaches).     [provider]  amoxicillin (AMOXIL) 500 MG capsule Take 2 capsules (1,000 mg total) by mouth 2 (two) times daily. 08/06/18   Orpah Greek, MD  atorvastatin (LIPITOR) 20 MG tablet Take 1 tablet (20 mg total) by mouth daily. 06/09/17   Alfonse Spruce, FNP  blood glucose meter kit and supplies Dispense based on patient and insurance preference. Use up to four times daily as directed. (FOR ICD-10 E10.9, E11.9). 06/03/17   Dessa Phi, DO  gabapentin (NEURONTIN) 300 MG capsule Take 1 capsule (300 mg total) by mouth at  bedtime. 06/08/17   Fredia Beets R, FNP  glucose blood test strip Use as instructed 06/26/17   Alfonse Spruce, FNP  ibuprofen (ADVIL,MOTRIN) 800 MG tablet Take 1 tablet (800 mg total) by mouth every 6 (six) hours as needed for moderate pain. 08/06/18   Orpah Greek, MD  Ibuprofen-Diphenhydramine HCl (ADVIL PM) 200-25 MG CAPS Take 1-2 capsules by mouth at bedtime as needed (for sleep).    [provider]  metFORMIN (GLUCOPHAGE) 500 MG tablet Take 1 tablet (500 mg total) by mouth daily with breakfast. 07/10/17   Ladell Pier, MD  naproxen sodium (ALEVE) 220 MG tablet Take 220-440 mg by mouth daily as needed (for pain or headaches).    [provider]  penicillin v potassium (VEETID) 500 MG tablet Take 1 tablet (500 mg total) by mouth 3 (three) times daily. 08/04/17   Robyn Haber, MD    Family History Family History  Problem Relation Age of Onset  . Hypertension Other     Social History Social History   Tobacco Use  . Smoking status: Current Every Day Smoker    Packs/day: 1.00    Types: Cigarettes  . Smokeless tobacco: Never Used  Substance Use Topics  . Alcohol use: No  . Drug use: No    Comment: h/o smoking marijuana quit     Allergies   Patient has no known allergies.  Review of Systems Review of Systems  Constitutional: Negative for fever.  HENT: Positive for dental problem and facial swelling.   All other systems reviewed and are negative.    Physical Exam Updated Vital Signs BP (!) 144/96   Pulse 68   Temp 98.1 F (36.7 C) (Oral)   Ht 5' 10"  (1.778 m)   Wt 104.3 kg   SpO2 95%   BMI 33.00 kg/m   Physical Exam Vitals signs and nursing note reviewed.  Constitutional:      General: He is not in acute distress.    Appearance: Normal appearance. He is well-developed.  HENT:     Head: Normocephalic and atraumatic.      Right Ear: Hearing normal.     Left Ear: Hearing normal.     Nose: Nose normal.      Mouth/Throat:     Dentition: Dental tenderness, gingival swelling and dental caries present.   Eyes:     Conjunctiva/sclera: Conjunctivae normal.     Pupils: Pupils are equal, round, and reactive to light.  Neck:     Musculoskeletal: Normal range of motion and neck supple.  Cardiovascular:     Rate and Rhythm: Regular rhythm.     Heart sounds: S1 normal and S2 normal. No murmur. No friction rub. No gallop.   Pulmonary:     Effort: Pulmonary effort is normal. No respiratory distress.     Breath sounds: Normal breath sounds.  Chest:     Chest wall: No tenderness.  Abdominal:     General: Bowel sounds are normal.     Palpations: Abdomen is soft.     Tenderness: There is no abdominal tenderness. There is no guarding or rebound. Negative signs include Murphy's sign and McBurney's sign.     Hernia: No hernia is present.  Musculoskeletal: Normal range of motion.  Skin:    General: Skin is warm and dry.     Findings: No rash.  Neurological:     Mental Status: He is alert and oriented to person, place, and time.     GCS: GCS eye subscore is 4. GCS verbal subscore is 5. GCS motor subscore is 6.     Cranial Nerves: No cranial nerve deficit.     Sensory: No sensory deficit.     Coordination: Coordination normal.  Psychiatric:        Speech: Speech normal.        Behavior: Behavior normal.        Thought Content: Thought content normal.      ED Treatments / Results  Labs (all labs ordered are listed, but only abnormal results are displayed) Labs Reviewed  CBG MONITORING, ED    EKG None  Radiology No results found.  Procedures Procedures (including critical care time)  Medications Ordered in ED Medications  amoxicillin (AMOXIL) capsule 1,000 mg (1,000 mg Oral Given 08/06/18 0602)  ibuprofen (ADVIL,MOTRIN) tablet 800 mg (800 mg Oral Given 08/06/18 0601)  acetaminophen (TYLENOL) tablet 1,000 mg (1,000 mg Oral Given 08/06/18 0601)     Initial Impression / Assessment and  Plan / ED Course  I have reviewed the triage vital signs and the nursing notes.  Pertinent labs & imaging results that were available during my care of the patient were reviewed by me and considered in my medical decision making (see chart for details).        Patient presents with right maxillary swelling associated with right upper incisor dental problem.  This consistent with early  dental abscess.  No airway problem.  No mandibular or floor of mouth swelling or symptoms.  Final Clinical Impressions(s) / ED Diagnoses   Final diagnoses:  Dental abscess    ED Discharge Orders         Ordered    amoxicillin (AMOXIL) 500 MG capsule  2 times daily     08/06/18 0526    ibuprofen (ADVIL,MOTRIN) 800 MG tablet  Every 6 hours PRN     08/06/18 0526           Orpah Greek, MD 08/07/18 [phone]

## 2018-08-06 NOTE — ED Triage Notes (Signed)
Pt c/o jaw pain and what feels like abscess since yesterday.

## 2018-08-25 NOTE — Telephone Encounter (Signed)
error 

## 2018-12-05 IMAGING — DX DG CHEST 1V PORT
1 series · 1 of 1 positions shown · non-contrast
Comparison: Chest CT dated 03/21/2016

CLINICAL DATA: 46-year-old male with hypoxia.

EXAM:
PORTABLE CHEST 1 VIEW

[chest]
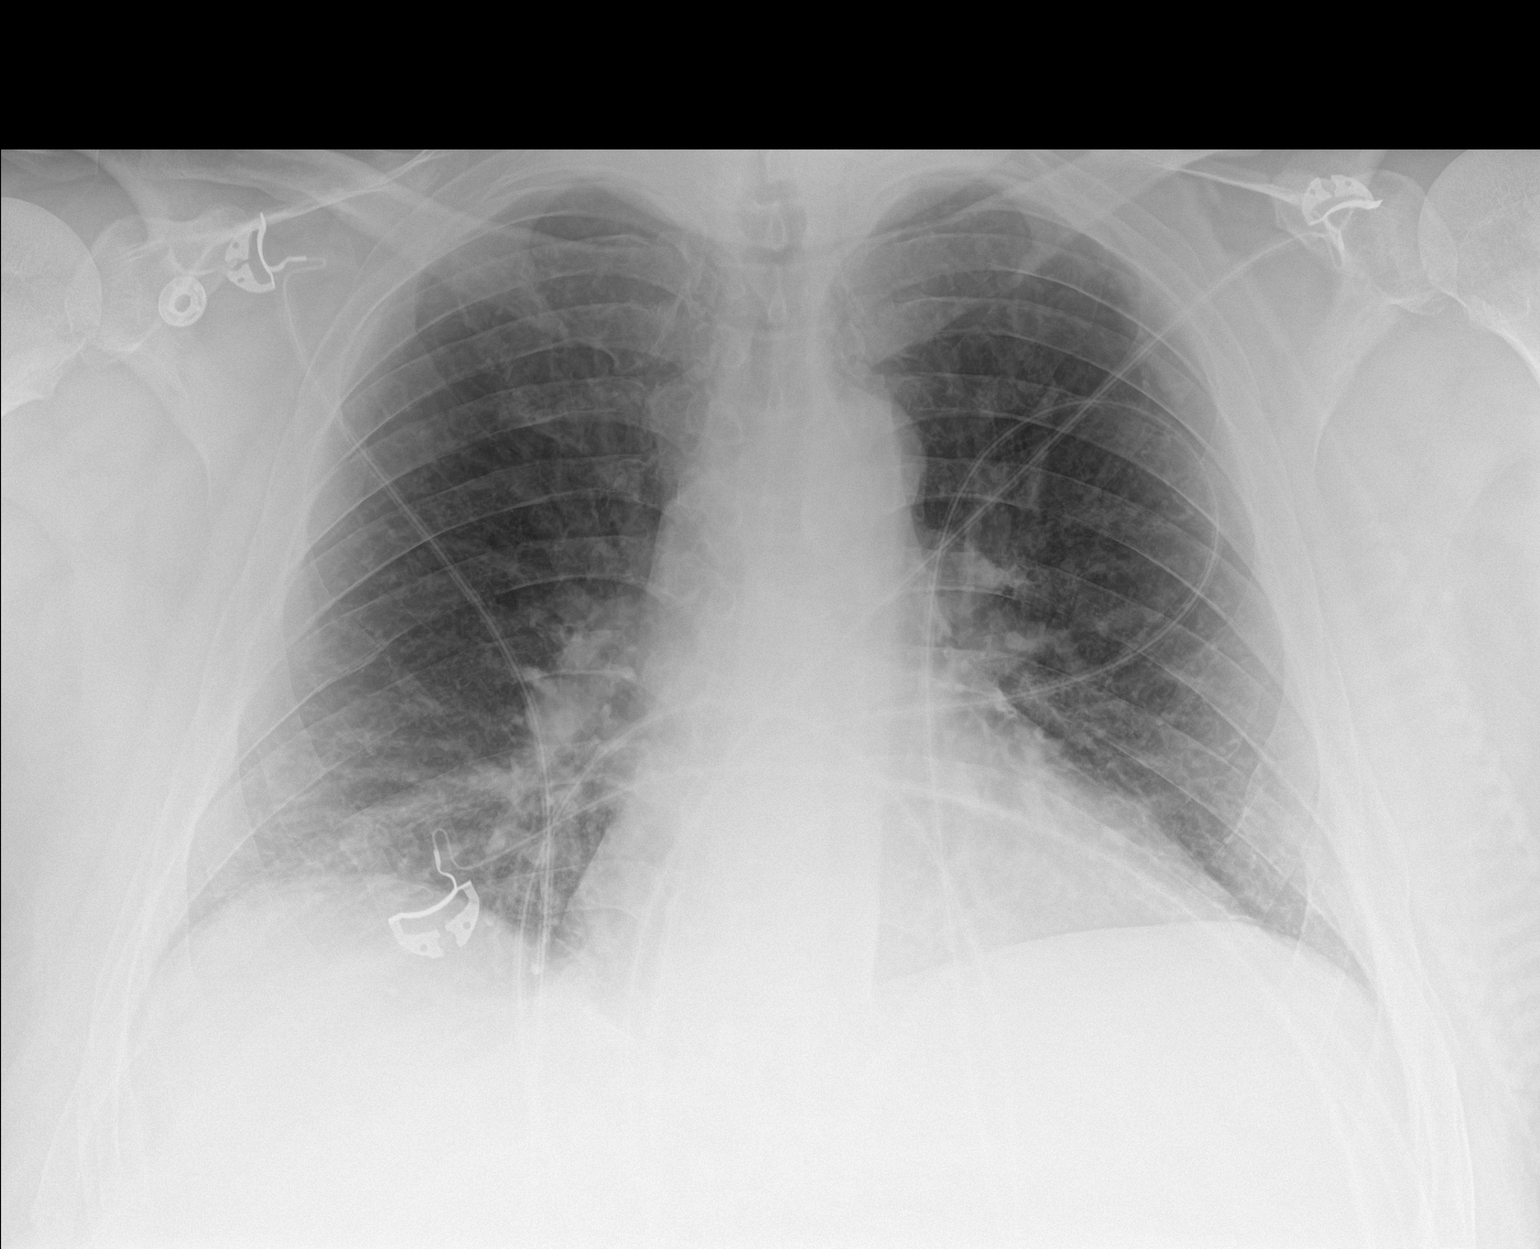

[1 of 1 positions shown; findings below may reference images not displayed]

FINDINGS: Mild diffuse interstitial coarsening and chronic bronchitic changes.
No focal consolidation, pleural effusion, or pneumothorax. The
cardiac silhouette is within normal limits. Mild widening of the
right AC joint, chronic. No acute osseous pathology.
IMPRESSION: No active disease.

## 2019-04-08 ENCOUNTER — Encounter (HOSPITAL_COMMUNITY): Payer: Self-pay | Admitting: Emergency Medicine

## 2019-04-08 ENCOUNTER — Other Ambulatory Visit: Payer: Self-pay

## 2019-04-08 ENCOUNTER — Emergency Department (HOSPITAL_COMMUNITY): Payer: Self-pay | Attending: Emergency Medicine

## 2019-04-08 ENCOUNTER — Emergency Department (HOSPITAL_COMMUNITY)
Admission: EM | Admit: 2019-04-08 | Discharge: 2019-04-08 | Disposition: A | Discharge status: Home / Self Care | Payer: PRIVATE HEALTH INSURANCE | Attending: Emergency Medicine | Admitting: Emergency Medicine

## 2019-04-08 DIAGNOSIS — Y9389 Activity, other specified: Secondary | ICD-10-CM | POA: Diagnosis not present

## 2019-04-08 DIAGNOSIS — S0182XA Laceration with foreign body of other part of head, initial encounter: Secondary | ICD-10-CM | POA: Diagnosis not present

## 2019-04-08 DIAGNOSIS — Y9289 Other specified places as the place of occurrence of the external cause: Secondary | ICD-10-CM | POA: Insufficient documentation

## 2019-04-08 DIAGNOSIS — I10 Essential (primary) hypertension: Secondary | ICD-10-CM | POA: Diagnosis not present

## 2019-04-08 DIAGNOSIS — W3189XA Contact with other specified machinery, initial encounter: Secondary | ICD-10-CM | POA: Diagnosis not present

## 2019-04-08 DIAGNOSIS — S0181XA Laceration without foreign body of other part of head, initial encounter: Secondary | ICD-10-CM | POA: Insufficient documentation

## 2019-04-08 DIAGNOSIS — F17210 Nicotine dependence, cigarettes, uncomplicated: Secondary | ICD-10-CM | POA: Insufficient documentation

## 2019-04-08 DIAGNOSIS — Z23 Encounter for immunization: Secondary | ICD-10-CM | POA: Diagnosis not present

## 2019-04-08 DIAGNOSIS — Y998 Other external cause status: Secondary | ICD-10-CM | POA: Insufficient documentation

## 2019-04-08 DIAGNOSIS — E119 Type 2 diabetes mellitus without complications: Secondary | ICD-10-CM | POA: Diagnosis not present

## 2019-04-08 DIAGNOSIS — S01511A Laceration without foreign body of lip, initial encounter: Secondary | ICD-10-CM | POA: Diagnosis present

## 2019-04-08 MED ORDER — BUPIVACAINE HCL (PF) 0.5 % IJ SOLN
10.0000 mL | Freq: Once | INTRAMUSCULAR | Status: AC
  Administered 2019-04-08: 10 mL
  Filled 2019-04-08: qty 10

## 2019-04-08 MED ORDER — ONDANSETRON 4 MG PO TBDP: 4.0000 mg | ORAL_TABLET | Freq: Three times a day (TID) | ORAL | 0 refills | Status: DC | PRN

## 2019-04-08 MED ORDER — LIDOCAINE-EPINEPHRINE (PF) 2 %-1:200000 IJ SOLN
10.0000 mL | Freq: Once | INTRAMUSCULAR | Status: AC
  Administered 2019-04-08: 10 mL
  Filled 2019-04-08: qty 20

## 2019-04-08 MED ORDER — NICOTINE 21 MG/24HR TD PT24
21.0000 mg | MEDICATED_PATCH | Freq: Once | TRANSDERMAL | Status: DC
  Administered 2019-04-08: 21 mg via TRANSDERMAL
  Filled 2019-04-08: qty 1

## 2019-04-08 MED ORDER — CLINDAMYCIN HCL 300 MG PO CAPS: 300.0000 mg | ORAL_CAPSULE | Freq: Three times a day (TID) | ORAL | 0 refills | Status: AC

## 2019-04-08 MED ORDER — TETANUS-DIPHTH-ACELL PERTUSSIS 5-2.5-18.5 LF-MCG/0.5 IM SUSP
0.5000 mL | Freq: Once | INTRAMUSCULAR | Status: AC
  Administered 2019-04-08: 0.5 mL via INTRAMUSCULAR
  Filled 2019-04-08: qty 0.5

## 2019-04-08 NOTE — Discharge Instructions (Signed)
°  Wound Care - General Wound Cleaning: Clean the wound and surrounding area gently with tap water and mild soap. Rinse well and blot dry. Do not scrub the wound, as this may cause the wound edges to come apart. You may shower, but avoid submerging the wound, such as with a bath or swimming.  Clean the wound daily to prevent infection.  Do not use cleaners such as hydrogen peroxide or alcohol.   Scar reduction: Application of a topical antibiotic ointment, such as Neosporin, after the wound has begun to close and heal well can decrease scab formation and reduce scarring. After the wound has healed, application of ointments such as Aquaphor can also reduce scar formation.  The key to scar reduction is keeping the skin well hydrated and supple. Drinking plenty of water throughout the day (At least eight 8oz glasses of water a day) is essential to staying well hydrated.  Sun exposure: Keep the wound out of the sun. After the wound has healed, continue to protect it from the sun by wearing protective clothing or applying sunscreen.  Pain: You may use Tylenol, naproxen, or ibuprofen for pain. Antiinflammatory medications: Take 600 mg of ibuprofen every 6 hours or 440 mg (over the counter dose) to 500 mg (prescription dose) of naproxen every 12 hours for the next 3 days. After this time, these medications may be used as needed for pain. Take these medications with food to avoid upset stomach. Choose only one of these medications, do not take them together. Acetaminophen (generic for Tylenol): Should you continue to have additional pain while taking the ibuprofen or naproxen, you may add in acetaminophen as needed. Your daily total maximum amount of acetaminophen from all sources should be limited to 4000mg /day for persons without liver problems, or 2000mg /day for those with liver problems.  Return: Return to the ED should the wound edges come apart or signs of infection arise, such as spreading redness,  puffiness/swelling, pus draining from the wound, severe increase in pain, fever over 100.51F, or any other major issues. Diet: Be sure to adhere to a diet of soft and liquid foods to avoid stress on the sutures.  This should be adhered to for at least 5 days.  For prescription assistance, may try using prescription discount sites or apps, such as goodrx.com

## 2019-04-08 NOTE — ED Provider Notes (Signed)
[facility] EMERGENCY DEPARTMENT Provider Note   CSN: [id number] Arrival date & time: 04/08/19  1243     History   Chief Complaint Chief Complaint  Patient presents with  . Lip Laceration  . Facial Laceration    HPI JORON VELIS is a 48 y.o. male.     HPI   DREVON PLOG is a 48 y.o. male, with a history of HTN, DM, and obesity, presenting to the ED with facial injury that occurred around 1 PM today. Patient was using a grinder when the grinding wheel broke and flew into his face.  The large piece of grinding wheel was embedded in his face.  He was able to remove it himself. He endorses localized pain and soreness. Patient states he was wearing eye protection at the time of injury. Denies other head or facial injury, LOC, dizziness, nausea/vomiting, difficulty moving the jaw, neck pain, shortness of breath, eye pain, vision changes, or any other complaints.  Past Medical History:  Diagnosis Date  . Diabetes mellitus without complication ([facility])   . Hypertension   . Obesity     Patient Active Problem List   Diagnosis Date Noted  . Controlled type 2 diabetes mellitus with complication, without long-term current use of insulin ([facility]) 07/10/2017  . Hyperlipidemia 07/10/2017  . Snoring 07/10/2017  . Obesity (BMI 35.0-39.9 without comorbidity) 07/10/2017    History reviewed. No pertinent surgical history.      Home Medications    Prior to Admission medications   Medication Sig Start Date End Date Taking? Authorizing Provider  clindamycin (CLEOCIN) 300 MG capsule Take 1 capsule (300 mg total) by mouth 3 (three) times daily for 7 days. 04/08/19 04/15/19  Baruch Lewers C, PA-C  ondansetron (ZOFRAN ODT) 4 MG disintegrating tablet Take 1 tablet (4 mg total) by mouth every 8 (eight) hours as needed for nausea or vomiting. 04/08/19   Mattia Osterman, Hillard Danker, PA-C    Family History Family History  Problem Relation Age of Onset  . Hypertension Other     Social  History Social History   Tobacco Use  . Smoking status: Current Every Day Smoker    Packs/day: 1.00    Types: Cigarettes  . Smokeless tobacco: Never Used  Substance Use Topics  . Alcohol use: No  . Drug use: No    Comment: h/o smoking marijuana quit     Allergies   Patient has no known allergies.   Review of Systems Review of Systems  HENT: Negative for trouble swallowing.   Respiratory: Negative for cough, shortness of breath and stridor.   Cardiovascular: Negative for chest pain.  Gastrointestinal: Negative for abdominal pain, nausea and vomiting.  Musculoskeletal: Negative for neck pain.  Skin: Positive for wound.  Neurological: Negative for dizziness and syncope.     Physical Exam Updated Vital Signs BP (!) 188/120   Pulse 80   Temp 98.2 F (36.8 C) (Oral)   Resp 18   Ht 5\' 10"  (1.778 m)   Wt 113.4 kg   SpO2 99%   BMI 35.87 kg/m   Physical Exam Vitals signs and nursing note reviewed.  Constitutional:      General: He is not in acute distress.    Appearance: He is well-developed. He is not diaphoretic.  HENT:     Head: Normocephalic.     Nose: Nose normal.     Mouth/Throat:     Mouth: Mucous membranes are moist.     Comments: Approximately 5 cm  linear laceration to the right lower face just inferior and lateral to the lower lip as shown in the photos.  There is a small area of through and through laceration into the inside of the mouth. He is able to open his mouth to about 2 finger widths. Some tenderness to the gingival surface, but although there is poor dentition, this area appears to be intact. No noted deformity or instability to the mandible, though there is some swelling and localized tenderness. No noted lingual injury or sublingual injury. No tenderness, swelling, or wounds noted to the submandibular or submental regions or to the soft tissues of the neck. There appears to be signs of good circulation to each of the tissue layers within the  wound as well as to the skin surrounding the wound. Eyes:     Extraocular Movements: Extraocular movements intact.     Conjunctiva/sclera: Conjunctivae normal.     Pupils: Pupils are equal, round, and reactive to light.     Comments: No pain with EOMs.  No swelling or tenderness noted around the eyes.  Neck:     Musculoskeletal: Normal range of motion and neck supple. No muscular tenderness.     Comments: Normal motor function intact in all extremities. No midline spinal tenderness.  Cardiovascular:     Rate and Rhythm: Normal rate and regular rhythm.     Pulses: Normal pulses.  Pulmonary:     Effort: Pulmonary effort is normal. No respiratory distress.  Abdominal:     Tenderness: There is no guarding.  Skin:    General: Skin is warm and dry.  Neurological:     Mental Status: He is alert and oriented to person, place, and time.     Comments: Sensation grossly intact to light touch in the extremities and throughout the face.  Grip strengths equal bilaterally.  Strength 5/5 in all extremities. No gait disturbance. Coordination intact. Cranial nerves III-XII grossly intact.  Smile appears equal bilaterally, except for some difference on the right due to swelling. No facial droop.   Psychiatric:        Mood and Affect: Mood and affect normal.        Speech: Speech normal.        Behavior: Behavior normal.                            ED Treatments / Results  Labs (all labs ordered are listed, but only abnormal results are displayed) Labs Reviewed - No data to display  EKG None  Radiology Ct Maxillofacial Wo Contrast  Result Date: 04/08/2019 CLINICAL DATA:  Maxillofacial trauma, hit by grinding blade EXAM: CT MAXILLOFACIAL WITHOUT CONTRAST TECHNIQUE: Multidetector CT imaging of the maxillofacial structures was performed. Multiplanar CT image reconstructions were also generated. COMPARISON:  None. FINDINGS: Osseous: No acute facial fracture. Temporomandibular  joints are unremarkable. There is diffuse dental disease. Orbits: Unremarkable. Sinuses: Minor mucosal thickening. Soft tissues: Soft tissue swelling on the right at the level of the lips with deep laceration. No radiopaque foreign body. Limited intracranial: No significant abnormality. IMPRESSION: No acute facial fracture. Electronically Signed   By: Macy Mis M.D.   On: 04/08/2019 16:59    Procedures .Nerve Block  Date/Time: 04/08/2019 6:20 PM Performed by: Lorayne Bender, PA-C Authorized by: Lorayne Bender, PA-C   Consent:    Consent obtained:  Verbal   Consent given by:  Patient   Risks discussed:  Allergic reaction, infection,  intravenous injection, nerve damage, pain, swelling and unsuccessful block Indications:    Indications:  Procedural anesthesia Location:    Body area:  Head   Head nerve blocked: Mental, intraoral approach.   Laterality:  Right Skin anesthesia (see MAR for exact dosages):    Skin anesthesia method:  Topical application   Topical anesthetic:  Benzocaine gel Procedure details (see MAR for exact dosages):    Block needle gauge:  27 G   Anesthetic injected:  Bupivacaine 0.5% WITH epi   Injection procedure:  Anatomic landmarks identified, anatomic landmarks palpated, incremental injection, introduced needle and negative aspiration for blood Post-procedure details:    Outcome:  Anesthesia achieved   Patient tolerance of procedure:  Tolerated well, no immediate complications .Marland Kitchen.Laceration Repair  Date/Time: 04/08/2019 7:00 PM Performed by: Anselm PancoastJoy, Wyndi Northrup C, PA-C Authorized by: Anselm PancoastJoy, Archer Moist C, PA-C   Consent:    Consent obtained:  Verbal   Consent given by:  Patient   Risks discussed:  Infection, pain, poor cosmetic result, poor wound healing, need for additional repair, nerve damage, vascular damage and retained foreign body Anesthesia (see MAR for exact dosages):    Anesthesia method:  Local infiltration and nerve block   Local anesthetic:  Bupivacaine 0.5%  w/o epi and lidocaine 2% WITH epi   Block location:  Mental nerve block   Block needle gauge:  27 G   Block anesthetic:  Bupivacaine 0.5% WITH epi   Block injection procedure:  Anatomic landmarks identified, anatomic landmarks palpated, introduced needle, negative aspiration for blood and incremental injection   Block outcome:  Anesthesia achieved Laceration details:    Location:  Face   Face location:  Chin   Length (cm):  5 Repair type:    Repair type:  Complex Pre-procedure details:    Preparation:  Patient was prepped and draped in usual sterile fashion and imaging obtained to evaluate for foreign bodies Exploration:    Wound extent: foreign bodies/material     Foreign bodies/material:  Black substance, possibly pieces of grinding wheel; and hair Treatment:    Area cleansed with:  Betadine and saline   Amount of cleaning:  Extensive   Irrigation solution:  Sterile saline   Irrigation method:  Syringe   Visualized foreign bodies/material removed: yes     Debridement:  Moderate Subcutaneous repair:    Suture size:  5-0   Suture material:  Vicryl (Vicryl Rapide used for the 11 retention sutures; Monocryl 5-0 used for running subcuticular)   Suture technique:  Figure eight and running   Number of sutures:  11 Approximation:    Approximation:  Close Post-procedure details:    Dressing:  Antibiotic ointment   Patient tolerance of procedure:  Tolerated well, no immediate complications .Foreign Body Removal  Date/Time: 04/08/2019 6:30 PM Performed by: Anselm PancoastJoy, Essie Gehret C, PA-C Authorized by: Anselm PancoastJoy, Khloi Rawl C, PA-C  Consent: Verbal consent obtained. Risks and benefits: risks, benefits and alternatives were discussed Consent given by: patient Patient understanding: patient states understanding of the procedure being performed Patient consent: the patient's understanding of the procedure matches consent given Patient identity confirmed: verbally with patient and provided demographic data  Body area: skin General location: head/neck Location details: face Anesthesia: nerve block and local infiltration  Anesthesia: Local Anesthetic: lidocaine 2% with epinephrine and bupivacaine 0.5% without epinephrine  Sedation: Patient sedated: no  Patient restrained: no Patient cooperative: yes Localization method: visualized Removal mechanism: irrigation and forceps Depth: deep 10 objects recovered. Objects recovered: possibly pieces of grinding wheel; hair Post-procedure  assessment: foreign body removed Patient tolerance: patient tolerated the procedure well with no immediate complications   (including critical care time)  Medications Ordered in ED Medications  lidocaine-EPINEPHrine (XYLOCAINE W/EPI) 2 %-1:200000 (PF) injection 10 mL (10 mLs Infiltration Given 04/08/19 1700)  Tdap (BOOSTRIX) injection 0.5 mL (0.5 mLs Intramuscular Given 04/08/19 1701)  bupivacaine (MARCAINE) 0.5 % injection 10 mL (10 mLs Infiltration Given 04/08/19 1700)     Initial Impression / Assessment and Plan / ED Course  I have reviewed the triage vital signs and the nursing notes.  Pertinent labs & imaging results that were available during my care of the patient were reviewed by me and considered in my medical decision making (see chart for details).  Clinical Course as of Apr 08 1425  Mon Apr 08, 2019  1728 Spoke with Dr. Pollyann Kennedy, ENT. We discussed the extent of the patient's injury as well as CT results.He agrees with plan for laceration repair here in the ED, antibiotic coverage, and office follow-up.   [SJ]    Clinical Course User Index [SJ] Yunior Jain C, PA-C       Patient presents with a large, deep laceration to the face.  He does not seem to have any neurologic deficits. No acute abnormalities on CT. Wound repaired in the ED without immediate complication. The patient was given instructions for home care as well as return precautions. Patient voices understanding of these  instructions, accepts the plan, and is comfortable with discharge.  Findings and plan of care discussed with Raeford Razor, MD. we also reviewed the pictures of the patient's wound as well as other physical exam findings.  Final Clinical Impressions(s) / ED Diagnoses   Final diagnoses:  Facial laceration, initial encounter    ED Discharge Orders         Ordered    clindamycin (CLEOCIN) 300 MG capsule  3 times daily    Note to Pharmacy: May fill as 150 mg capsules if more economical for the patient.   04/08/19 2015    ondansetron (ZOFRAN ODT) 4 MG disintegrating tablet  Every 8 hours PRN     04/08/19 2015           Anselm Pancoast, PA-C 04/09/19 1428    Terald Sleeper, MD 04/09/19 [phone]

## 2019-04-08 NOTE — ED Notes (Signed)
Bacitracin applied to wound on face, pt verbalized understanding of would care, follow up and medications.  Pt ambulatory to [place] with steady gait, NAD

## 2019-04-08 NOTE — ED Triage Notes (Signed)
Onset today patient using a grinder and a composite blade hit right lip and face and took piece out prior to arrival. Bleeding controlled at this time.

## 2019-10-05 IMAGING — US US SCROTUM
1 series · 13 of 25 positions shown · non-contrast
Comparison: 05/30/2017 CT pelvis.

CLINICAL DATA: Scrotal swelling for 1 week.

EXAM:
SCROTAL ULTRASOUND
DOPPLER ULTRASOUND OF THE TESTICLES
TECHNIQUE: Complete ultrasound examination of the testicles, epididymis, and
other scrotal structures was performed. Color and spectral Doppler
ultrasound were also utilized to evaluate blood flow to the
testicles.

[Series 1: us scrotum · 64 acquisitions, 13 frames shown]
[im 1/64]
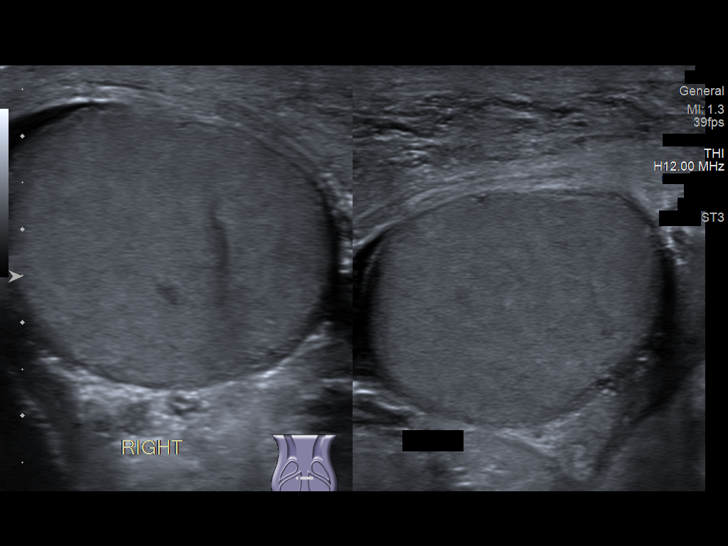
[im 6/64]
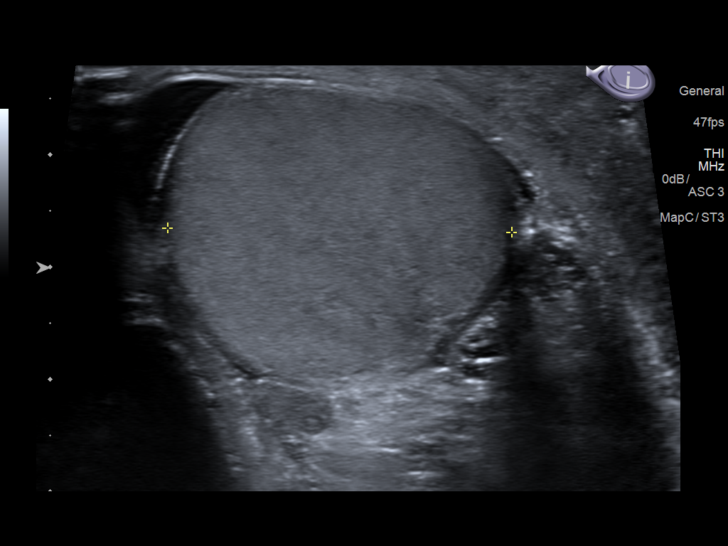
[im 11/64]
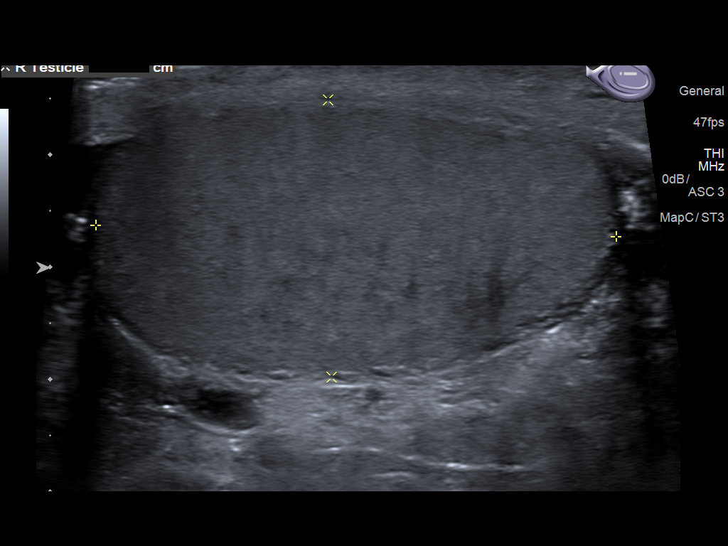
[im 16/64]
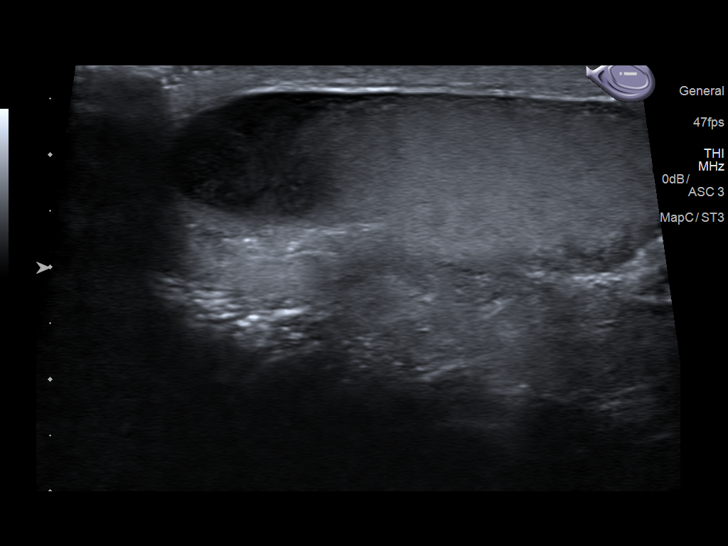
[im 22/64]
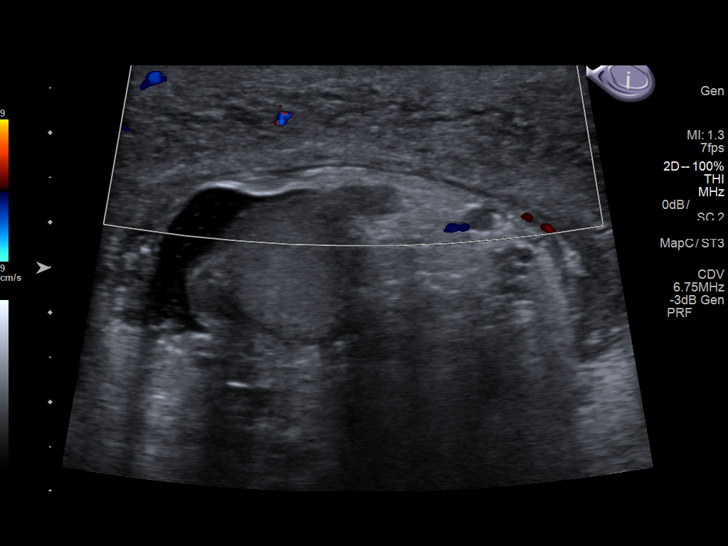
[im 27/64]
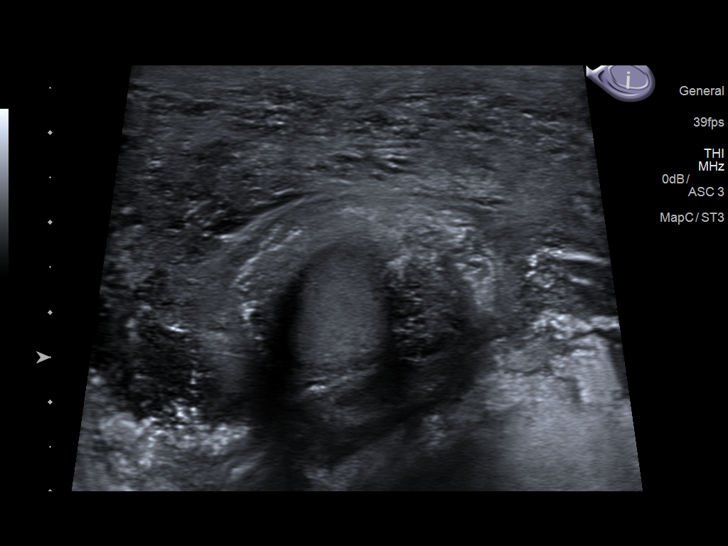
[im 32/64]
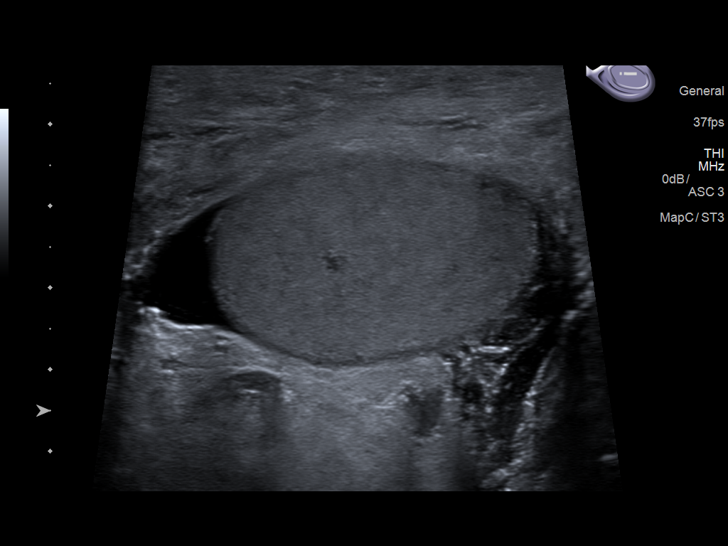
[im 37/64]
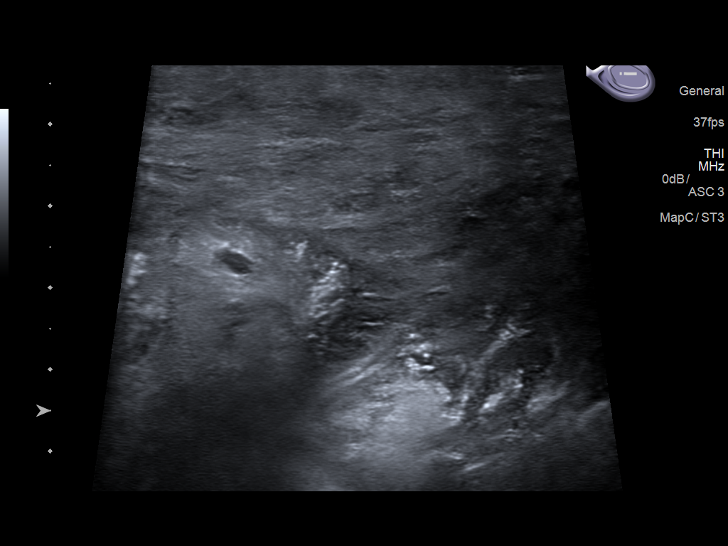
[im 43/64]
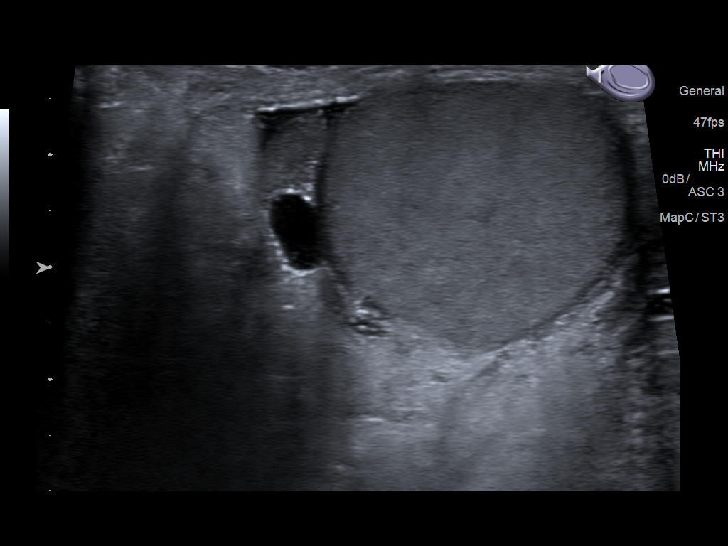
[im 48/64]
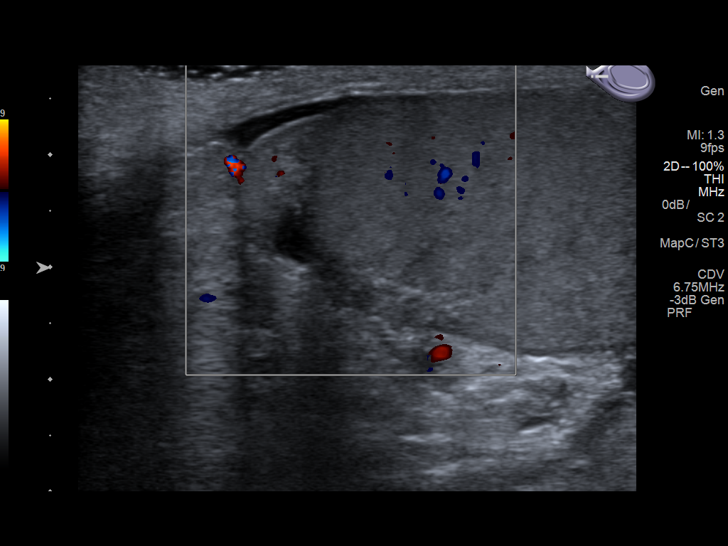
[im 53/64]
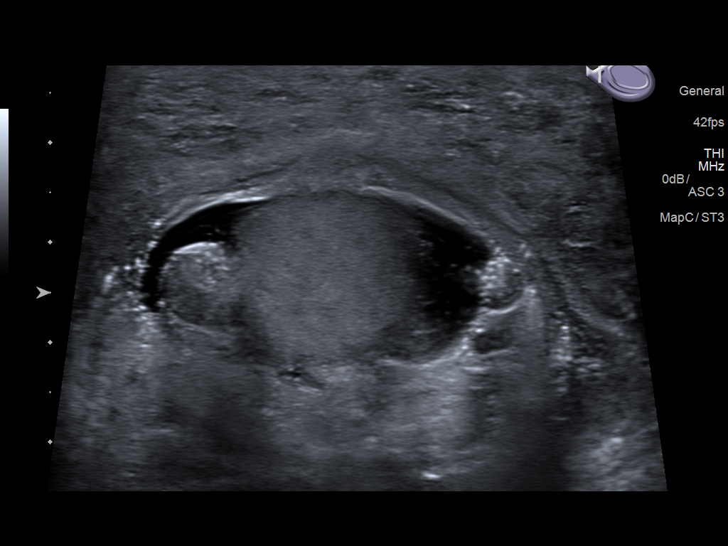
[im 58/64]
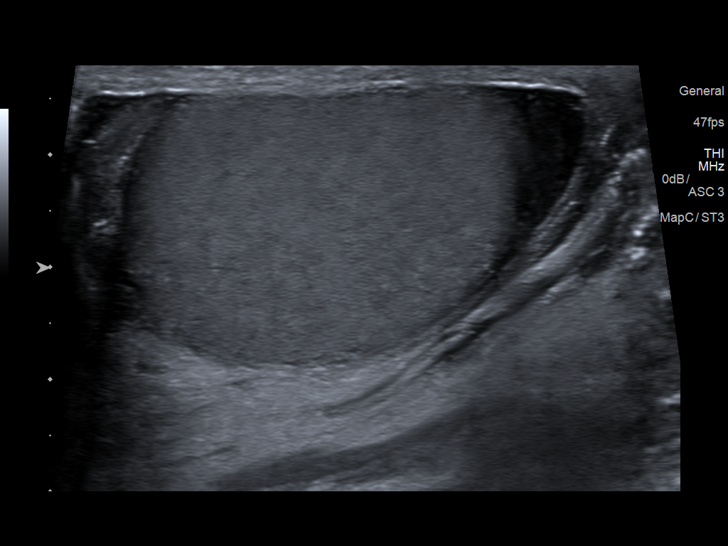
[im 64/64]
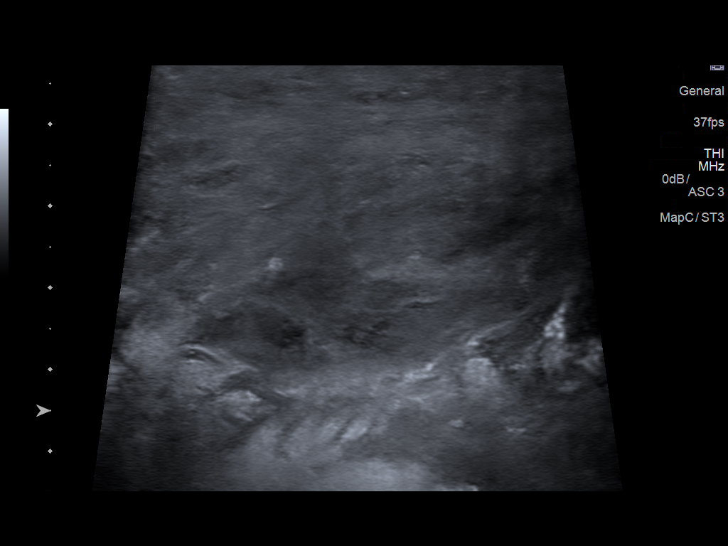

[13 of 25 positions shown; findings below may reference images not displayed]

FINDINGS: Right testicle

Measurements: 4.6 x 2.5 x 3.1 cm. No mass or microlithiasis
visualized.

Left testicle

Measurements: 4.2 x 2.9 x 3.1 cm. No mass or microlithiasis
visualized.

Right epididymis: Simple 6 x 5 x 4 mm cyst versus spermatocele in
the right epididymal head. Otherwise normal right epididymis.

Left epididymis:  Normal in size and appearance.

Hydrocele:  No significant hydrocele.

Varicocele:  Mild left varicocele.  No right varicocele.

Prominent diffuse subcutaneous edema throughout the scrotum, left
greater than right.

Pulsed Doppler interrogation of both testes demonstrates normal low
resistance arterial and venous waveforms bilaterally.
IMPRESSION: 1. No evidence of testicular torsion. Normal testes with no
testicular mass.
2. Prominent diffuse subcutaneous edema throughout the scrotum, left
greater than right. No significant hydroceles.
3. Mild left varicocele.
4. Simple subcentimeter cyst versus spermatocele in the right
epididymal head. Otherwise normal epididymides.

## 2020-01-06 ENCOUNTER — Emergency Department (HOSPITAL_COMMUNITY)
Admission: EM | Admit: 2020-01-06 | Discharge: 2020-01-06 | Disposition: A | Discharge status: Home / Self Care | Payer: Self-pay | Attending: Emergency Medicine | Admitting: Emergency Medicine

## 2020-01-06 ENCOUNTER — Other Ambulatory Visit: Payer: Self-pay

## 2020-01-06 ENCOUNTER — Emergency Department (HOSPITAL_COMMUNITY): Payer: Self-pay

## 2020-01-06 ENCOUNTER — Encounter (HOSPITAL_COMMUNITY): Payer: Self-pay

## 2020-01-06 DIAGNOSIS — E119 Type 2 diabetes mellitus without complications: Secondary | ICD-10-CM | POA: Insufficient documentation

## 2020-01-06 DIAGNOSIS — I10 Essential (primary) hypertension: Secondary | ICD-10-CM | POA: Insufficient documentation

## 2020-01-06 DIAGNOSIS — F17210 Nicotine dependence, cigarettes, uncomplicated: Secondary | ICD-10-CM | POA: Insufficient documentation

## 2020-01-06 DIAGNOSIS — K429 Umbilical hernia without obstruction or gangrene: Secondary | ICD-10-CM | POA: Insufficient documentation

## 2020-01-06 LAB — CBC
HCT: 51.4 % (ref 39.0–52.0)
Hemoglobin: 16.9 g/dL (ref 13.0–17.0)
MCH: 29.5 pg (ref 26.0–34.0)
MCHC: 32.9 g/dL (ref 30.0–36.0)
MCV: 89.9 fL (ref 80.0–100.0)
Platelets: 262 10*3/uL (ref 150–400)
RBC: 5.72 MIL/uL (ref 4.22–5.81)
RDW: 13.5 % (ref 11.5–15.5)
WBC: 10.9 10*3/uL — ABNORMAL HIGH (ref 4.0–10.5)
nRBC: 0 % (ref 0.0–0.2)

## 2020-01-06 LAB — COMPREHENSIVE METABOLIC PANEL
ALT: 11 U/L (ref 0–44)
AST: 12 U/L — ABNORMAL LOW (ref 15–41)
Albumin: 3.5 g/dL (ref 3.5–5.0)
Alkaline Phosphatase: 42 U/L (ref 38–126)
Anion gap: 10 (ref 5–15)
BUN: 13 mg/dL (ref 6–20)
CO2: 22 mmol/L (ref 22–32)
Calcium: 8.8 mg/dL — ABNORMAL LOW (ref 8.9–10.3)
Chloride: 104 mmol/L (ref 98–111)
Creatinine, Ser: 0.86 mg/dL (ref 0.61–1.24)
GFR calc Af Amer: >60 mL/min (ref >60)
GFR calc non Af Amer: >60 mL/min (ref >60)
Glucose, Bld: 104 mg/dL — ABNORMAL HIGH (ref 70–99)
Potassium: 3.9 mmol/L (ref 3.5–5.1)
Sodium: 136 mmol/L (ref 135–145)
Total Bilirubin: 0.7 mg/dL (ref 0.3–1.2)
Total Protein: 6.1 g/dL — ABNORMAL LOW (ref 6.5–8.1)

## 2020-01-06 LAB — LIPASE, BLOOD: Lipase: 26 U/L (ref 11–51)

## 2020-01-06 MED ORDER — IOHEXOL 300 MG/ML  SOLN
100.0000 mL | Freq: Once | INTRAMUSCULAR | Status: AC | PRN
  Administered 2020-01-06: 100 mL via INTRAVENOUS

## 2020-01-06 NOTE — ED Provider Notes (Addendum)
[facility] EMERGENCY DEPARTMENT Provider Note   CSN: [id number] Arrival date & time: 01/06/20  6789     History Chief Complaint  Patient presents with  . hernia pain    Shawn Patrick is a 49 y.o. male.  Patient with a history of an umbilical hernia.  He is known to have it for period of time.  But does not have insurance for repair.  This morning he was lifting heavy object had increased pain and tightness of the umbilicus.  Patient states that usually protruding out when he standing up.  But does not usually cause pain.  Does go down at night when he lays flat.  Landed the room patient was laying on his back.  You could see the protrusion at the umbilicus.  Patient denies any nausea or vomiting.  The pain started just this morning.        Past Medical History:  Diagnosis Date  . Diabetes mellitus without complication (HCC)   . Hypertension   . Obesity     Patient Active Problem List   Diagnosis Date Noted  . Controlled type 2 diabetes mellitus with complication, without long-term current use of insulin (HCC) 07/10/2017  . Hyperlipidemia 07/10/2017  . Snoring 07/10/2017  . Obesity (BMI 35.0-39.9 without comorbidity) 07/10/2017    History reviewed. No pertinent surgical history.     Family History  Problem Relation Age of Onset  . Hypertension Other     Social History   Tobacco Use  . Smoking status: Current Every Day Smoker    Packs/day: 1.00    Types: Cigarettes  . Smokeless tobacco: Never Used  Substance Use Topics  . Alcohol use: No  . Drug use: No    Comment: h/o smoking marijuana quit    Home Medications Prior to Admission medications   Medication Sig Start Date End Date Taking? Authorizing Provider  ondansetron (ZOFRAN ODT) 4 MG disintegrating tablet Take 1 tablet (4 mg total) by mouth every 8 (eight) hours as needed for nausea or vomiting. 04/08/19   Joy, Hillard Danker, PA-C    Allergies    Patient has no known  allergies.  Review of Systems   Review of Systems  Constitutional: Negative for chills and fever.  HENT: Negative for congestion, rhinorrhea and sore throat.   Eyes: Negative for visual disturbance.  Respiratory: Negative for cough and shortness of breath.   Cardiovascular: Negative for chest pain and leg swelling.  Gastrointestinal: Positive for abdominal pain. Negative for diarrhea, nausea and vomiting.  Genitourinary: Negative for dysuria.  Musculoskeletal: Negative for back pain and neck pain.  Skin: Negative for rash.  Neurological: Negative for dizziness, light-headedness and headaches.  Hematological: Does not bruise/bleed easily.  Psychiatric/Behavioral: Negative for confusion.    Physical Exam Updated Vital Signs BP (!) 164/98 (BP Location: Left Arm)   Pulse 68   Temp 98.2 F (36.8 C) (Oral)   Resp (!) 22   Ht 1.753 m (5\' 9" )   Wt 106.6 kg   SpO2 99%   BMI 34.70 kg/m   Physical Exam Vitals and nursing note reviewed.  Constitutional:      Appearance: Normal appearance. He is well-developed.  HENT:     Head: Normocephalic and atraumatic.  Eyes:     Conjunctiva/sclera: Conjunctivae normal.     Pupils: Pupils are equal, round, and reactive to light.  Cardiovascular:     Rate and Rhythm: Normal rate and regular rhythm.     Heart sounds: No  murmur heard.   Pulmonary:     Effort: Pulmonary effort is normal. No respiratory distress.     Breath sounds: Normal breath sounds.  Abdominal:     Palpations: Abdomen is soft.     Tenderness: There is abdominal tenderness.     Hernia: A hernia is present.     Comments: Tenderness to palpation around the umbilicus.  Protruding mass at the umbilicus.  No erythema.  The mass was reducible.  After some manipulation.  Musculoskeletal:        General: No swelling. Normal range of motion.     Cervical back: Normal range of motion and neck supple.  Skin:    General: Skin is warm and dry.     Capillary Refill: Capillary refill  takes less than 2 seconds.  Neurological:     General: No focal deficit present.     Mental Status: He is alert and oriented to person, place, and time.     Cranial Nerves: No cranial nerve deficit.     Sensory: No sensory deficit.     Motor: No weakness.     ED Results / Procedures / Treatments   Labs (all labs ordered are listed, but only abnormal results are displayed) Labs Reviewed  CBC - Abnormal; Notable for the following components:      Result Value   WBC 10.9 (*)    All other components within normal limits  COMPREHENSIVE METABOLIC PANEL  LIPASE, BLOOD    EKG None  Radiology No results found.  Procedures Procedures (including critical care time)  Medications Ordered in ED Medications - No data to display  ED Course  I have reviewed the triage vital signs and the nursing notes.  Pertinent labs & imaging results that were available during my care of the patient were reviewed by me and considered in my medical decision making (see chart for details).    MDM Rules/Calculators/A&P                          Patient with umbilical hernia.  Believe it was reduced by me.  Will get CT scan abdomen and labs.  Patient in no acute distress.  T scan shows evidence of the umbilical hernia.  Has some omental fat in there.  No evidence of any bowel.  Hernia still feels reduced to me clinically.  Patient feels much better.  Will refer patient to [facility] surgery to have the hernia repaired.  Precautions provided.   Final Clinical Impression(s) / ED Diagnoses Final diagnoses:  Reducible umbilical hernia    Rx / DC Orders ED Discharge Orders    None       Vanetta Mulders, MD 01/06/20 1059    Vanetta Mulders, MD 01/06/20 [phone]

## 2020-01-06 NOTE — ED Notes (Signed)
Pt verbalized understanding of discharge instructions. Follow up care reviewed, pt had no further questions. Pt ambulated independently to lobby 

## 2020-01-06 NOTE — Discharge Instructions (Signed)
Make an appointment to follow-up with surgery.  Information provided above.  Return if the hernia gets stuck again.  Or for any nausea vomiting or fevers.  At this point is recommended that she get the umbilical hernia repaired.

## 2020-01-06 NOTE — ED Triage Notes (Signed)
Patient has known umbilical hernia and this am after lifting heavy object has had increased pain and tightness at umbilicus.

## 2020-01-30 ENCOUNTER — Inpatient Hospital Stay: Payer: Self-pay | Admitting: Physician Assistant

## 2020-12-31 ENCOUNTER — Emergency Department (HOSPITAL_BASED_OUTPATIENT_CLINIC_OR_DEPARTMENT_OTHER)
Admission: EM | Admit: 2020-12-31 | Discharge: 2020-12-31 | Disposition: A | Discharge status: Home / Self Care | Payer: PRIVATE HEALTH INSURANCE | Attending: Emergency Medicine | Admitting: Emergency Medicine

## 2020-12-31 ENCOUNTER — Other Ambulatory Visit: Payer: Self-pay

## 2020-12-31 ENCOUNTER — Emergency Department (HOSPITAL_BASED_OUTPATIENT_CLINIC_OR_DEPARTMENT_OTHER): Payer: PRIVATE HEALTH INSURANCE | Admitting: Radiology

## 2020-12-31 ENCOUNTER — Encounter (HOSPITAL_BASED_OUTPATIENT_CLINIC_OR_DEPARTMENT_OTHER): Payer: Self-pay

## 2020-12-31 DIAGNOSIS — E119 Type 2 diabetes mellitus without complications: Secondary | ICD-10-CM | POA: Insufficient documentation

## 2020-12-31 DIAGNOSIS — Y93H2 Activity, gardening and landscaping: Secondary | ICD-10-CM | POA: Insufficient documentation

## 2020-12-31 DIAGNOSIS — I10 Essential (primary) hypertension: Secondary | ICD-10-CM | POA: Insufficient documentation

## 2020-12-31 DIAGNOSIS — M545 Low back pain, unspecified: Secondary | ICD-10-CM | POA: Diagnosis present

## 2020-12-31 DIAGNOSIS — Y990 Civilian activity done for income or pay: Secondary | ICD-10-CM | POA: Insufficient documentation

## 2020-12-31 DIAGNOSIS — W1789XA Other fall from one level to another, initial encounter: Secondary | ICD-10-CM | POA: Insufficient documentation

## 2020-12-31 DIAGNOSIS — F17210 Nicotine dependence, cigarettes, uncomplicated: Secondary | ICD-10-CM | POA: Diagnosis not present

## 2020-12-31 MED ORDER — METHOCARBAMOL 500 MG PO TABS: 500.0000 mg | ORAL_TABLET | Freq: Two times a day (BID) | ORAL | 0 refills | Status: DC | PRN

## 2020-12-31 MED ORDER — IBUPROFEN 800 MG PO TABS: 800.0000 mg | ORAL_TABLET | Freq: Three times a day (TID) | ORAL | 0 refills | Status: DC | PRN

## 2020-12-31 NOTE — ED Provider Notes (Signed)
[facility] EMERGENCY DEPT Provider Note   CSN: [id number] Arrival date & time: 12/31/20  0915     History Chief Complaint  Patient presents with   Back Pain    W/c    Shawn Patrick is a 50 y.o. male.  He is here with a complaint of low back pain after a fall at work yesterday.  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  No loss of consciousness.  He did not have any pain initially but is noticing some spasm especially with twisting or going from sitting to standing.  No abdominal pain no numbness or weakness.  No pain radiating down the legs.  No bowel or bladder incontinence.  Has tried nothing for it.  The history is provided by the patient.  Back Pain Location:  Lumbar spine and gluteal region Quality:  Stabbing Radiates to:  Does not radiate Pain severity:  Moderate Timing:  Intermittent Progression:  Unchanged Chronicity:  New Context: falling   Relieved by:  None tried Worsened by:  Movement and twisting Ineffective treatments:  Bed rest Associated symptoms: no abdominal pain, no bladder incontinence, no bowel incontinence, no chest pain, no dysuria, no fever, no leg pain, no numbness and no weakness       Past Medical History:  Diagnosis Date   Diabetes mellitus without complication (HCC)    Hypertension    Obesity     Patient Active Problem List   Diagnosis Date Noted   Controlled type 2 diabetes mellitus with complication, without long-term current use of insulin (HCC) 07/10/2017   Hyperlipidemia 07/10/2017   Snoring 07/10/2017   Obesity (BMI 35.0-39.9 without comorbidity) 07/10/2017    History reviewed. No pertinent surgical history.     Family History  Problem Relation Age of Onset   Hypertension Other     Social History   Tobacco Use   Smoking status: Every Day    Packs/day: 1.00    Types: Cigarettes   Smokeless tobacco: Never  Vaping Use   Vaping Use: Never used  Substance Use Topics    Alcohol use: No   Drug use: No    Comment: h/o smoking marijuana quit    Home Medications Prior to Admission medications   Medication Sig Start Date End Date Taking? Authorizing Provider  ondansetron (ZOFRAN ODT) 4 MG disintegrating tablet Take 1 tablet (4 mg total) by mouth every 8 (eight) hours as needed for nausea or vomiting. 04/08/19   Joy, Hillard Danker, PA-C    Allergies    Patient has no known allergies.  Review of Systems   Review of Systems  Constitutional:  Negative for fever.  Cardiovascular:  Negative for chest pain.  Gastrointestinal:  Negative for abdominal pain and bowel incontinence.  Genitourinary:  Negative for bladder incontinence and dysuria.  Musculoskeletal:  Positive for back pain.  Neurological:  Negative for weakness and numbness.   Physical Exam Updated Vital Signs BP (!) 138/93   Pulse 96   Temp 97.8 F (36.6 C) (Oral)   Resp 16   Ht 5\' 10"  (1.778 m)   Wt 113.4 kg   SpO2 96%   BMI 35.87 kg/m   Physical Exam Vitals and nursing note reviewed.  Constitutional:      Appearance: Normal appearance. He is well-developed.  HENT:     Head: Normocephalic and atraumatic.  Eyes:     Conjunctiva/sclera: Conjunctivae normal.  Cardiovascular:     Rate and Rhythm: Normal  rate and regular rhythm.     Heart sounds: No murmur heard. Pulmonary:     Effort: Pulmonary effort is normal. No respiratory distress.     Breath sounds: Normal breath sounds.  Abdominal:     Palpations: Abdomen is soft.     Tenderness: There is no abdominal tenderness.  Musculoskeletal:        General: Tenderness present.     Cervical back: Neck supple.     Comments: He has no midline spine tenderness.  There is left paralumbar tenderness into his left gluteal area.  Skin:    General: Skin is warm and dry.  Neurological:     General: No focal deficit present.     Mental Status: He is alert.     Sensory: No sensory deficit.     Motor: No weakness.     Gait: Gait normal.     ED Results / Procedures / Treatments   Labs (all labs ordered are listed, but only abnormal results are displayed) Labs Reviewed - No data to display  EKG None  Radiology DG Lumbar Spine Complete  Result Date: 12/31/2020 CLINICAL DATA:  Low back pain after fall. EXAM: LUMBAR SPINE - COMPLETE 4+ VIEW COMPARISON:  CT abdomen pelvis dated January 06, 2020. FINDINGS: There is no evidence of lumbar spine fracture. Chronic mild wedging of the L1 vertebral body. Alignment is normal. Intervertebral disc spaces are maintained. IMPRESSION: Negative. Electronically Signed   By: Obie Dredge M.D.   On: 12/31/2020 10:18    Procedures Procedures   Medications Ordered in ED Medications - No data to display  ED Course  I have reviewed the triage vital signs and the nursing notes.  Pertinent labs & imaging results that were available during my care of the patient were reviewed by me and considered in my medical decision making (see chart for details).  Clinical Course as of 12/31/20 1730  Thu Dec 31, 2020  1007 X-rays of lumbar spine do not show any acute fracture.  Awaiting radiology reading. [MB]    Clinical Course User Index [MB] Terrilee Files, MD   MDM Rules/Calculators/A&P                           50 year old male here complaining of low back pain after mechanical fall.  He is neurologically intact.  X-ray imaging does not show any acute fracture.  Currently no indications for advanced imaging.  Will cover with NSAIDs and muscle relaxant and recommended close follow-up with primary care doctor.  Return instructions discussed Final Clinical Impression(s) / ED Diagnoses Final diagnoses:  Left lumbar pain    Rx / DC Orders ED Discharge Orders          Ordered    ibuprofen (ADVIL) 800 MG tablet  Every 8 hours PRN        12/31/20 1008    methocarbamol (ROBAXIN) 500 MG tablet  2 times daily PRN        12/31/20 1008             Terrilee Files, MD 12/31/20  1731

## 2020-12-31 NOTE — ED Triage Notes (Signed)
Reports fell inside of a dump trailer hurting lower back. Denies numbness or tingling to either leg.

## 2020-12-31 NOTE — ED Notes (Signed)
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 He denies any dizziness prior to fall. Patient additionally denies hitting his head or any bruising from the fall.

## 2020-12-31 NOTE — Discharge Instructions (Signed)
You were seen in the emergency department for low back pain after a fall at work.  Your x-rays did not show any obvious fractures.  This is likely muscle strain and spasm.  We are putting you on some anti-inflammatories and spasm medication.  Please contact your primary care doctor for close follow-up.  Return to the emergency department if any worsening or concerning symptoms.

## 2021-12-07 ENCOUNTER — Emergency Department (HOSPITAL_BASED_OUTPATIENT_CLINIC_OR_DEPARTMENT_OTHER)
Admission: EM | Admit: 2021-12-07 | Discharge: 2021-12-07 | Disposition: A | Discharge status: Home / Self Care | Payer: Self-pay | Attending: Emergency Medicine | Admitting: Emergency Medicine

## 2021-12-07 ENCOUNTER — Emergency Department (HOSPITAL_BASED_OUTPATIENT_CLINIC_OR_DEPARTMENT_OTHER): Payer: Self-pay | Admitting: Radiology

## 2021-12-07 ENCOUNTER — Other Ambulatory Visit: Payer: Self-pay

## 2021-12-07 ENCOUNTER — Encounter (HOSPITAL_BASED_OUTPATIENT_CLINIC_OR_DEPARTMENT_OTHER): Payer: Self-pay

## 2021-12-07 DIAGNOSIS — W230XXA Caught, crushed, jammed, or pinched between moving objects, initial encounter: Secondary | ICD-10-CM | POA: Insufficient documentation

## 2021-12-07 DIAGNOSIS — Y990 Civilian activity done for income or pay: Secondary | ICD-10-CM | POA: Insufficient documentation

## 2021-12-07 DIAGNOSIS — S61211A Laceration without foreign body of left index finger without damage to nail, initial encounter: Secondary | ICD-10-CM | POA: Insufficient documentation

## 2021-12-07 MED ORDER — CEPHALEXIN 500 MG PO CAPS: 500.0000 mg | ORAL_CAPSULE | Freq: Four times a day (QID) | ORAL | 0 refills | Status: DC

## 2021-12-07 MED ORDER — LIDOCAINE HCL (PF) 1 % IJ SOLN
5.0000 mL | Freq: Once | INTRAMUSCULAR | Status: AC
  Administered 2021-12-07: 5 mL via INTRADERMAL
  Filled 2021-12-07: qty 5

## 2021-12-07 MED ORDER — CEPHALEXIN 250 MG PO CAPS
250.0000 mg | ORAL_CAPSULE | Freq: Once | ORAL | Status: AC
  Administered 2021-12-07: 250 mg via ORAL
  Filled 2021-12-07: qty 1

## 2021-12-07 NOTE — Discharge Instructions (Addendum)
Begin taking Keflex as prescribed.  Perform dressing changes with bacitracin twice daily.  Wear finger splint for comfort and support.  Sutures are to be removed in 7 to 10 days.  Please follow-up with your primary doctor for this.  Return to the ER if you develop pus draining from the wound, red streaks extending up the finger or hand, or for other new and concerning symptoms.

## 2021-12-07 NOTE — ED Triage Notes (Signed)
Patient here POV from Home.  Endorses sustaining a Crush type Injury to Left Second Digit in which he pinched the Tissue of his Finger between two heavy stone slabs. 2 cm Laceration  Tetanus Updated Last Year. Approximately 3-4 Hours ago. No Anticoagulants.   NAD Noted during Triage. A&Ox4. GCS 15. Ambulatory.

## 2021-12-07 NOTE — ED Notes (Signed)
Reviewed AVS/discharge instruction with patient. Time allotted for and all questions answered. Patient is agreeable for d/c and escorted to ed exit by staff.  

## 2021-12-07 NOTE — ED Provider Notes (Addendum)
[facility] EMERGENCY DEPT Provider Note   CSN: [id number] Arrival date & time: 12/07/21  1922     History  Chief Complaint  Patient presents with   Finger Injury    Shawn Patrick is a 51 y.o. male.  Patient is a 51 year old male presenting with a left index finger injury.  He was at work today when his finger was smashed between 2 concrete blocks.  He sustained a laceration to the finger pad of the left index finger.  Last tetanus was 2 years ago.  Bleeding controlled with direct pressure.  The history is provided by the patient.       Home Medications Prior to Admission medications   Medication Sig Start Date End Date Taking? Authorizing Provider  ibuprofen (ADVIL) 800 MG tablet Take 1 tablet (800 mg total) by mouth every 8 (eight) hours as needed for moderate pain. 12/31/20   Terrilee Files, MD  methocarbamol (ROBAXIN) 500 MG tablet Take 1 tablet (500 mg total) by mouth 2 (two) times daily as needed for muscle spasms. 12/31/20   Terrilee Files, MD  ondansetron (ZOFRAN ODT) 4 MG disintegrating tablet Take 1 tablet (4 mg total) by mouth every 8 (eight) hours as needed for nausea or vomiting. 04/08/19   Joy, Hillard Danker, PA-C      Allergies    Patient has no known allergies.    Review of Systems   Review of Systems  All other systems reviewed and are negative.   Physical Exam Updated Vital Signs BP (!) 141/92 (BP Location: Right Arm)   Pulse 66   Temp 98 F (36.7 C) (Temporal)   Resp 16   Ht 5\' 10"  (1.778 m)   Wt 113.4 kg   SpO2 96%   BMI 35.87 kg/m  Physical Exam Vitals and nursing note reviewed.  Constitutional:      General: He is not in acute distress.    Appearance: Normal appearance. He is not ill-appearing.  HENT:     Head: Normocephalic and atraumatic.  Pulmonary:     Effort: Pulmonary effort is normal.  Musculoskeletal:     Comments: To the left finger pad, there is a 1.5 cm laceration noted.  There is no obvious foreign body or  debris.  Skin:    General: Skin is warm and dry.  Neurological:     Mental Status: He is alert.     ED Results / Procedures / Treatments   Labs (all labs ordered are listed, but only abnormal results are displayed) Labs Reviewed - No data to display  EKG None  Radiology DG Finger Index Left  Result Date: 12/07/2021 CLINICAL DATA:  Left Distal Index Finger Injury EXAM: LEFT INDEX FINGER 2+V COMPARISON:  None Available. FINDINGS: There is no evidence of fracture or dislocation. There is no evidence of arthropathy or other focal bone abnormality. Soft tissues are unremarkable. No retained radiopaque foreign body. IMPRESSION: No acute displaced fracture or dislocation. Electronically Signed   By: 12/09/2021 M.D.   On: 12/07/2021 20:28    Procedures Procedures    Medications Ordered in ED Medications  lidocaine (PF) (XYLOCAINE) 1 % injection 5 mL (has no administration in time range)  cephALEXin (KEFLEX) capsule 250 mg (has no administration in time range)    ED Course/ Medical Decision Making/ A&P  X-rays negative for fracture.  Laceration repaired as below.  Patient to perform local wound care and return as needed.  As the wound has been open for  8 hours and there was some contamination, he will be treated with Keflex.  To return as needed for any problems.  LACERATION REPAIR Performed by: Geoffery Lyons Authorized by: Geoffery Lyons Consent: Verbal consent obtained. Risks and benefits: risks, benefits and alternatives were discussed Consent given by: patient Patient identity confirmed: provided demographic data Prepped and Draped in normal sterile fashion Wound explored  Laceration Location: Left index finger pad  Laceration Length: 1.5 cm  No Foreign Bodies seen or palpated  Anesthesia: local infiltration  Local anesthetic: lidocaine 1% without epinephrine  Anesthetic total: 1 ml  Irrigation method: syringe Amount of cleaning: standard  Skin closure: 5-0  Prolene  Number of sutures: 3  Technique: Simple interrupted  Patient tolerance: Patient tolerated the procedure well with no immediate complications.   Final Clinical Impression(s) / ED Diagnoses Final diagnoses:  None    Rx / DC Orders ED Discharge Orders     None         Geoffery Lyons, MD 12/07/21 1191    Geoffery Lyons, MD 12/07/21 2333

## 2021-12-18 ENCOUNTER — Emergency Department (HOSPITAL_BASED_OUTPATIENT_CLINIC_OR_DEPARTMENT_OTHER)
Admission: EM | Admit: 2021-12-18 | Discharge: 2021-12-18 | Disposition: A | Discharge status: Home / Self Care | Payer: Self-pay | Attending: Emergency Medicine | Admitting: Emergency Medicine

## 2021-12-18 ENCOUNTER — Encounter (HOSPITAL_BASED_OUTPATIENT_CLINIC_OR_DEPARTMENT_OTHER): Payer: Self-pay

## 2021-12-18 DIAGNOSIS — Z4802 Encounter for removal of sutures: Secondary | ICD-10-CM | POA: Insufficient documentation

## 2021-12-18 DIAGNOSIS — X58XXXD Exposure to other specified factors, subsequent encounter: Secondary | ICD-10-CM | POA: Insufficient documentation

## 2021-12-18 DIAGNOSIS — S61219D Laceration without foreign body of unspecified finger without damage to nail, subsequent encounter: Secondary | ICD-10-CM | POA: Insufficient documentation

## 2021-12-18 MED ORDER — DOXYCYCLINE HYCLATE 100 MG PO CAPS: 100.0000 mg | ORAL_CAPSULE | Freq: Two times a day (BID) | ORAL | 0 refills | Status: DC

## 2021-12-18 NOTE — ED Triage Notes (Signed)
Pt presents POV from home to have sutures removed. They were placed here on 12/07/21  Pt reports some swelling to the tip of his finger.

## 2021-12-18 NOTE — ED Provider Notes (Signed)
[facility] EMERGENCY DEPT Provider Note   CSN: [id number] Arrival date & time: 12/18/21  1304     History  Chief Complaint  Patient presents with   Suture / Staple Removal    Shawn Patrick is a 51 y.o. male.  51 year old male returns for suture removal from index finger which were placed 10 days ago.  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  Denies other complaints or concerns.       Home Medications Prior to Admission medications   Medication Sig Start Date End Date Taking? Authorizing Provider  doxycycline (VIBRAMYCIN) 100 MG capsule Take 1 capsule (100 mg total) by mouth 2 (two) times daily. 12/18/21  Yes Jeannie Fend, PA-C  ibuprofen (ADVIL) 800 MG tablet Take 1 tablet (800 mg total) by mouth every 8 (eight) hours as needed for moderate pain. 12/31/20   Terrilee Files, MD  methocarbamol (ROBAXIN) 500 MG tablet Take 1 tablet (500 mg total) by mouth 2 (two) times daily as needed for muscle spasms. 12/31/20   Terrilee Files, MD  ondansetron (ZOFRAN ODT) 4 MG disintegrating tablet Take 1 tablet (4 mg total) by mouth every 8 (eight) hours as needed for nausea or vomiting. 04/08/19   Joy, Hillard Danker, PA-C      Allergies    Patient has no known allergies.    Review of Systems   Review of Systems Negative except as per HPI Physical Exam Updated Vital Signs BP (!) 153/101   Pulse 80   Temp 98.4 F (36.9 C)   Resp 18   SpO2 96%  Physical Exam Vitals and nursing note reviewed.  Constitutional:      General: He is not in acute distress.    Appearance: He is well-developed. He is not diaphoretic.  HENT:     Head: Normocephalic and atraumatic.  Cardiovascular:     Pulses: Normal pulses.  Pulmonary:     Effort: Pulmonary effort is normal.  Musculoskeletal:        General: No swelling or tenderness.  Skin:    General: Skin is warm and dry.     Comments: Sutures in place to tip of finger, palmar aspect.  Does have trace  amount of purulent drainage after removal of the 3 sutures.  Neurological:     Mental Status: He is alert and oriented to person, place, and time.     Sensory: No sensory deficit.     Motor: No weakness.  Psychiatric:        Behavior: Behavior normal.     ED Results / Procedures / Treatments   Labs (all labs ordered are listed, but only abnormal results are displayed) Labs Reviewed - No data to display  EKG None  Radiology No results found.  Procedures Procedures    Medications Ordered in ED Medications - No data to display  ED Course/ Medical Decision Making/ A&P                           Medical Decision Making Risk Prescription drug management.   3 sutures removed without difficulty from finger.  Trace purulent drainage present.  Will cover with doxycycline, was given Keflex at prior visit.  Recommend patient recheck with his PCP in 2 days.        Final Clinical Impression(s) / ED Diagnoses Final diagnoses:  Visit for suture removal    Rx / DC Orders ED Discharge Orders  Ordered    doxycycline (VIBRAMYCIN) 100 MG capsule  2 times daily        12/18/21 1655              Jeannie Fend, PA-C 12/18/21 1657    Sloan Leiter, DO 12/19/21 [phone]

## 2021-12-18 NOTE — Discharge Instructions (Signed)
Take doxycycline as prescribed and complete the full course.  Recommend wound recheck with your PCP in 2 days due to concerns for infection on exam today.

## 2021-12-19 ENCOUNTER — Telehealth: Payer: Self-pay

## 2021-12-19 NOTE — ED Notes (Signed)
Pt called requesting a different abx, states doxycycline is $85 w/o insurance. EDP researched and found it cheaper at other locations, the alternative ABX are more expensive. Left a VM for pt to call this rn at his convenience

## 2021-12-19 NOTE — Telephone Encounter (Signed)
RNCM spoke with [facility] [phone] who advised the discount card on file was the cheapest $52.14. This RNCM provided the [employer] member ID [id number] resulting in cost of $35.07.    RNCM spoke with patient to advise the cost will be $35.07 at [facility] patient verbalized that he can  afford the $35.07 and will pick up the medication.  No additional TOC needs at this time.

## 2021-12-19 NOTE — Telephone Encounter (Signed)
RN CM received secure chat re: patient inability to afford his abx (doxycycline 100mg  acp, #20). This RNCM spoke with patient about medications. Patient reports he has no insurance and works as a . PAtient reports he was told his medications would cost $85.  RNCM will continue to follow.

## 2022-01-23 ENCOUNTER — Other Ambulatory Visit: Payer: Self-pay

## 2022-01-23 ENCOUNTER — Emergency Department (HOSPITAL_COMMUNITY)

## 2022-01-23 ENCOUNTER — Emergency Department (HOSPITAL_COMMUNITY)
Admission: EM | Admit: 2022-01-23 | Discharge: 2022-01-23 | Disposition: A | Discharge status: Home / Self Care | Attending: Emergency Medicine | Admitting: Emergency Medicine

## 2022-01-23 ENCOUNTER — Encounter (HOSPITAL_COMMUNITY): Payer: Self-pay

## 2022-01-23 DIAGNOSIS — W010XXA Fall on same level from slipping, tripping and stumbling without subsequent striking against object, initial encounter: Secondary | ICD-10-CM | POA: Diagnosis not present

## 2022-01-23 DIAGNOSIS — M25511 Pain in right shoulder: Secondary | ICD-10-CM | POA: Diagnosis not present

## 2022-01-23 DIAGNOSIS — M25562 Pain in left knee: Secondary | ICD-10-CM | POA: Diagnosis not present

## 2022-01-23 DIAGNOSIS — I10 Essential (primary) hypertension: Secondary | ICD-10-CM | POA: Insufficient documentation

## 2022-01-23 DIAGNOSIS — E119 Type 2 diabetes mellitus without complications: Secondary | ICD-10-CM | POA: Insufficient documentation

## 2022-01-23 MED ORDER — HYDROCODONE-ACETAMINOPHEN 5-325 MG PO TABS: 1.0000 | ORAL_TABLET | Freq: Four times a day (QID) | ORAL | 0 refills | Status: AC | PRN

## 2022-01-23 NOTE — ED Provider Triage Note (Signed)
Emergency Medicine Provider Triage Evaluation Note  Shawn Patrick , a 51 y.o. male  was evaluated in triage.  Pt complains of fall. States that same occurred Friday when he was loading a trailer. He states that he tripped and fell and struck his left knee on the ground and his right shoulder on the trailer door. He did not hit his head or loose consciousness. He states that since the event he has had decreased ROM of his shoulder that is not improving. He has been ambulatory since the event. He is not anticoagulated.  Review of Systems  Positive:  Negative:   Physical Exam  BP (!) 157/94 (BP Location: Right Arm)   Pulse 98   Temp 97.8 F (36.6 C) (Oral)   Resp 15   SpO2 92%  Gen:   Awake, no distress   Resp:  Normal effort  MSK:   Moves extremities without difficulty  Other:  Mild tenderness noted over the humoral head of the right shoulder. Radial pulse intact 2+  Medical Decision Making  Medically screening exam initiated at 9:12 AM.  Appropriate orders placed.  Damita Dunnings was informed that the remainder of the evaluation will be completed by another provider, this initial triage assessment does not replace that evaluation, and the importance of remaining in the ED until their evaluation is complete.     Silva Bandy, PA-C 01/23/22 [phone]

## 2022-01-23 NOTE — ED Triage Notes (Signed)
Patient had mechanical fall on Friday hitting right shoulder and left knee, ambulatory

## 2022-01-23 NOTE — Discharge Instructions (Addendum)
As we discussed, your work-up in the ER today was reassuring for acute abnormalities.  X-ray imaging of your shoulder and knee did not show any fracture or dislocation.  This does not rule out damage to your tendons or ligaments.  To fully rule injury to this out you will need to follow-up with orthopedics for continued evaluation and management of your symptoms.  Have given you a referral with a number to call to schedule an appointment for continued evaluation and management of your symptoms.  I have also given you a prescription for short course of narcotic pain medication to help with your pain.  Please only take this as prescribed as needed for severe pain only.  Do not drive or operate heavy machinery after taking this medication.  Return if development of any new or worsening symptoms.

## 2022-01-23 NOTE — ED Notes (Signed)
Pt ambulated to bathroom independently

## 2022-01-23 NOTE — ED Provider Notes (Signed)
[facility] EMERGENCY DEPARTMENT Provider Note   CSN: [id number] Arrival date & time: 01/23/22  0901     History  No chief complaint on file.   Shawn Patrick is a 51 y.o. male.  Patient with history of diabetes and hypertension presents today with complaints of right shoulder and left knee pain. States that on Friday afternoon when he was loading a trailer, he tripped and fell and struck his left knee on the ground and his right shoulder on the trailer door. He did not hit his head or loose consciousness. He states that since the event he has had decreased ROM of his shoulder that is not improving. Also endorses some mild left knee pain. He has been ambulatory since the event. He is not anticoagulated.  The history is provided by the patient. No language interpreter was used.       Home Medications Prior to Admission medications   Medication Sig Start Date End Date Taking? Authorizing Provider  doxycycline (VIBRAMYCIN) 100 MG capsule Take 1 capsule (100 mg total) by mouth 2 (two) times daily. 12/18/21   Jeannie Fend, PA-C  ibuprofen (ADVIL) 800 MG tablet Take 1 tablet (800 mg total) by mouth every 8 (eight) hours as needed for moderate pain. 12/31/20   Terrilee Files, MD  methocarbamol (ROBAXIN) 500 MG tablet Take 1 tablet (500 mg total) by mouth 2 (two) times daily as needed for muscle spasms. 12/31/20   Terrilee Files, MD  ondansetron (ZOFRAN ODT) 4 MG disintegrating tablet Take 1 tablet (4 mg total) by mouth every 8 (eight) hours as needed for nausea or vomiting. 04/08/19   Joy, Hillard Danker, PA-C      Allergies    Patient has no known allergies.    Review of Systems   Review of Systems  All other systems reviewed and are negative.   Physical Exam Updated Vital Signs BP 132/84 (BP Location: Left Arm)   Pulse 72   Temp 97.8 F (36.6 C) (Oral)   Resp 16   SpO2 94%  Physical Exam Vitals and nursing note reviewed.  Constitutional:      General: He  is not in acute distress.    Appearance: Normal appearance. He is normal weight. He is not ill-appearing, toxic-appearing or diaphoretic.  HENT:     Head: Normocephalic and atraumatic.  Cardiovascular:     Rate and Rhythm: Normal rate.  Pulmonary:     Effort: Pulmonary effort is normal. No respiratory distress.  Musculoskeletal:        General: Normal range of motion.     Cervical back: Normal range of motion.     Comments: Tenderness noted to palpation of the right humoral head without deformity. ROM limited, unable to abduct the shoulder past 90 degrees. Unable to flex the shoulder past 90 degrees. Radial pulse intact and 2+. No overlying skin changes, crepitus, or warmth.  Tenderness noted to palpation of the anterior left knee. No deformity or laxity. No overlying skin changes, erythema, or warmth. DP and PT pulses intact and 2+. Ambulatory with normal gait.  Skin:    General: Skin is warm and dry.  Neurological:     General: No focal deficit present.     Mental Status: He is alert.  Psychiatric:        Mood and Affect: Mood normal.        Behavior: Behavior normal.     ED Results / Procedures / Treatments   Labs (  all labs ordered are listed, but only abnormal results are displayed) Labs Reviewed - No data to display  EKG None  Radiology DG Knee Complete 4 Views Left  Result Date: 01/23/2022 CLINICAL DATA:  Fall 2 days ago with left knee pain. EXAM: LEFT KNEE - COMPLETE 4+ VIEW COMPARISON:  None Available. FINDINGS: No evidence of fracture, dislocation, or joint effusion. No evidence of arthropathy or other focal bone abnormality. Soft tissues are unremarkable. IMPRESSION: Negative. Electronically Signed   By: Elberta Fortis M.D.   On: 01/23/2022 09:50   DG Shoulder Right  Result Date: 01/23/2022 CLINICAL DATA:  Fall 2 days ago with right shoulder pain. EXAM: RIGHT SHOULDER - 2+ VIEW COMPARISON:  None Available. FINDINGS: There is no evidence of fracture or dislocation.  There is no evidence of arthropathy or other focal bone abnormality. Soft tissues are unremarkable. IMPRESSION: Negative. Electronically Signed   By: Elberta Fortis M.D.   On: 01/23/2022 09:50    Procedures Procedures    Medications Ordered in ED Medications - No data to display  ED Course/ Medical Decision Making/ A&P                           Medical Decision Making Amount and/or Complexity of Data Reviewed Radiology: ordered.   Patient presents today with right shoulder pain and left knee pain after a fall 2 days ago.  He is afebrile, nontoxic-appearing, and in no acute distress with reassuring vital signs.  He is also ambulatory with a normal gait.  X-ray imaging obtained of his shoulder and the for further evaluation.  Both imaging was negative for acute findings.  I have personally reviewed and interpreted this imaging and agree with radiology interpretation.  Patient does have reduced ROM to his right shoulder concerning for rotator cuff injury.  Given this, I have given him a referral to orthopedics for follow-up for continued evaluation and management.  I have also given him a prescription for short course of narcotics for pain control.  I have personally reviewed PDMP and deemed patient adequate candidate for this.  I have informed her not to drive or operate heavy machinery while taking this medication.  I have also recommended RICE and NSAIDs.  Patient is understanding and amenable with plan.  He is stable for discharge.  Educated on red flag symptoms that would prompt immediate return.  Patient discharged in stable condition.   Final Clinical Impression(s) / ED Diagnoses Final diagnoses:  Acute pain of right shoulder    Rx / DC Orders ED Discharge Orders          Ordered    HYDROcodone-acetaminophen (NORCO/VICODIN) 5-325 MG tablet  Every 6 hours PRN        01/23/22 1550          An After Visit Summary was printed and given to the patient.     Vear Clock 01/23/22 1609    Tegeler, Canary Brim, MD 01/23/22 1945

## 2023-03-04 ENCOUNTER — Emergency Department (HOSPITAL_BASED_OUTPATIENT_CLINIC_OR_DEPARTMENT_OTHER)
Admission: EM | Admit: 2023-03-04 | Discharge: 2023-03-04 | Disposition: A | Discharge status: Home / Self Care | Payer: No Typology Code available for payment source | Attending: Emergency Medicine | Admitting: Emergency Medicine

## 2023-03-04 ENCOUNTER — Encounter (HOSPITAL_BASED_OUTPATIENT_CLINIC_OR_DEPARTMENT_OTHER): Payer: Self-pay

## 2023-03-04 DIAGNOSIS — L02429 Furuncle of limb, unspecified: Secondary | ICD-10-CM

## 2023-03-04 DIAGNOSIS — E119 Type 2 diabetes mellitus without complications: Secondary | ICD-10-CM | POA: Insufficient documentation

## 2023-03-04 DIAGNOSIS — I10 Essential (primary) hypertension: Secondary | ICD-10-CM | POA: Diagnosis not present

## 2023-03-04 DIAGNOSIS — L02425 Furuncle of right lower limb: Secondary | ICD-10-CM | POA: Diagnosis present

## 2023-03-04 MED ORDER — DOXYCYCLINE HYCLATE 100 MG PO CAPS: 100.0000 mg | ORAL_CAPSULE | Freq: Two times a day (BID) | ORAL | 0 refills | Status: AC

## 2023-03-04 NOTE — ED Triage Notes (Signed)
He c/o feeling "something bite my (right) leg the other day". He has a small abscess at outer, proximal right calf. He tells me he "squeezed it and some stuff came out of it this morning".

## 2023-03-04 NOTE — Discharge Instructions (Signed)
Thank you for allowing [place] to be a part of your care today.  I have sent over doxycycline, an antibiotic to treat the infection in your leg.  This will cover for MRSA.  You may continue to do warm compresses to encourage the area to drain.  You may also use topical antibiotic (Neosporin, Bacitracin) to the area.   Follow-up with your PCP early next week if you feel like symptoms or not improving.  Return to the ED if you develop sudden worsening of your symptoms or if you have new concerns.

## 2023-03-04 NOTE — ED Notes (Signed)
Dc instructions reviewed with patient. Patient voiced understanding. Dc with belongings.  °

## 2023-03-04 NOTE — ED Provider Notes (Signed)
[place] EMERGENCY DEPARTMENT AT [facility] Provider Note   CSN: [id number] Arrival date & time: 03/04/23  4098     History  No chief complaint on file.   Shawn Patrick is a 52 y.o. male with past medical history significant for diabetes, hypertension presents to the ED complaining of a possible spider or other insect bite to his right leg.  Patient states that he felt something bite his leg the other day and now has a small abscess to the outer calf.  Patient states that he "squeezed it and some stuff came out of it this morning" after his shower.  Denies fever, chills, muscle cramping.        Home Medications Prior to Admission medications   Medication Sig Start Date End Date Taking? Authorizing Provider  doxycycline (VIBRAMYCIN) 100 MG capsule Take 1 capsule (100 mg total) by mouth 2 (two) times daily for 7 days. 03/04/23 03/11/23 Yes Aliany Fiorenza R, PA-C  ibuprofen (ADVIL) 800 MG tablet Take 1 tablet (800 mg total) by mouth every 8 (eight) hours as needed for moderate pain. 12/31/20   Terrilee Files, MD  methocarbamol (ROBAXIN) 500 MG tablet Take 1 tablet (500 mg total) by mouth 2 (two) times daily as needed for muscle spasms. 12/31/20   Terrilee Files, MD  ondansetron (ZOFRAN ODT) 4 MG disintegrating tablet Take 1 tablet (4 mg total) by mouth every 8 (eight) hours as needed for nausea or vomiting. 04/08/19   Joy, Hillard Danker, PA-C      Allergies    Patient has no known allergies.    Review of Systems   Review of Systems  Constitutional:  Negative for chills and fever.  Skin:  Positive for color change (erythema) and wound.    Physical Exam Updated Vital Signs BP (!) 148/90 (BP Location: Right Arm)   Pulse 74   Temp 98.2 F (36.8 C) (Oral)   Resp 18   SpO2 100%  Physical Exam Vitals and nursing note reviewed.  Constitutional:      General: He is not in acute distress.    Appearance: Normal appearance. He is not ill-appearing or diaphoretic.   Cardiovascular:     Rate and Rhythm: Normal rate and regular rhythm.  Pulmonary:     Effort: Pulmonary effort is normal.  Skin:    General: Skin is warm and dry.     Capillary Refill: Capillary refill takes less than 2 seconds.       Neurological:     Mental Status: He is alert. Mental status is at baseline.  Psychiatric:        Mood and Affect: Mood normal.        Behavior: Behavior normal.     ED Results / Procedures / Treatments   Labs (all labs ordered are listed, but only abnormal results are displayed) Labs Reviewed - No data to display  EKG None  Radiology No results found.  Procedures Procedures    Medications Ordered in ED Medications - No data to display  ED Course/ Medical Decision Making/ A&P                                 Medical Decision Making  This patient presents to the ED with chief complaint(s) of possible spider bite/abscess with pertinent past medical history of diabetes.  The complaint involves an extensive differential diagnosis and also carries with it a high risk  of complications and morbidity.    The differential diagnosis includes insect bite, cellulitis, abscess   Initial Assessment:   Exam significant for superficial 3 mm abscess with surrounding erythema and induration.  Purulent drainage easily is easily expressed from the area.  No significant area of fluctuance to suggest deeper abscess.  Disposition:   Patient reports that he easily gets abscesses, cysts/boils on his skin.  Will cover for MRSA.  Suspect this could have initially been an insect bite that has become infected.  Advised patient to follow-up early next week with PCP if symptoms do not begin to improve.  Strict return precautions were discussed.  Discussed other supportive care including use of topical antimicrobial and warm compresses to keep the site open and draining.    Social Determinants of Health:   Patient's  tobacco dependence  increases the complexity of  managing their presentation         Final Clinical Impression(s) / ED Diagnoses Final diagnoses:  Furuncle of lower leg    Rx / DC Orders ED Discharge Orders          Ordered    doxycycline (VIBRAMYCIN) 100 MG capsule  2 times daily        03/04/23 0918              Melton Alar R, PA-C 03/04/23 6387    Alvira Monday, MD 03/04/23 2349

## 2023-04-21 ENCOUNTER — Emergency Department (HOSPITAL_BASED_OUTPATIENT_CLINIC_OR_DEPARTMENT_OTHER)
Admission: EM | Admit: 2023-04-21 | Discharge: 2023-04-21 | Disposition: A | Discharge status: Home / Self Care | Payer: No Typology Code available for payment source | Attending: Emergency Medicine | Admitting: Emergency Medicine

## 2023-04-21 ENCOUNTER — Other Ambulatory Visit: Payer: Self-pay

## 2023-04-21 DIAGNOSIS — K047 Periapical abscess without sinus: Secondary | ICD-10-CM | POA: Insufficient documentation

## 2023-04-21 DIAGNOSIS — K0889 Other specified disorders of teeth and supporting structures: Secondary | ICD-10-CM | POA: Diagnosis present

## 2023-04-21 MED ORDER — CLINDAMYCIN HCL 150 MG PO CAPS: 450.0000 mg | ORAL_CAPSULE | Freq: Four times a day (QID) | ORAL | 0 refills | Status: AC

## 2023-04-21 MED ORDER — KETOROLAC TROMETHAMINE 15 MG/ML IJ SOLN
15.0000 mg | Freq: Once | INTRAMUSCULAR | Status: AC
  Administered 2023-04-21: 15 mg via INTRAMUSCULAR
  Filled 2023-04-21: qty 1

## 2023-04-21 MED ORDER — CLINDAMYCIN HCL 150 MG PO CAPS
450.0000 mg | ORAL_CAPSULE | Freq: Once | ORAL | Status: AC
  Administered 2023-04-21: 450 mg via ORAL
  Filled 2023-04-21: qty 3

## 2023-04-21 NOTE — ED Provider Notes (Signed)
[place] EMERGENCY DEPARTMENT AT [facility] Provider Note   CSN: [id number] Arrival date & time: 04/21/23  1034     History  Chief Complaint  Patient presents with   Dental Pain    Shawn Patrick is a 52 y.o. male.  52 yo M with a chief complaints of right lower dental pain and facial swelling.  The patient unfortunately struggled with poor dentition for at least 12 years.  He feels like his teeth become very brittle and are just breaking and he does not even notice it.  He has not had dental insurance in quite some time and it has been a long time since he seen a dentist.  He denies fevers or chills.  Has had worsening pain and facial swelling over the course of the week.   Dental Pain      Home Medications Prior to Admission medications   Medication Sig Start Date End Date Taking? Authorizing Provider  ibuprofen (ADVIL) 800 MG tablet Take 1 tablet (800 mg total) by mouth every 8 (eight) hours as needed for moderate pain. 12/31/20   Terrilee Files, MD  methocarbamol (ROBAXIN) 500 MG tablet Take 1 tablet (500 mg total) by mouth 2 (two) times daily as needed for muscle spasms. 12/31/20   Terrilee Files, MD  ondansetron (ZOFRAN ODT) 4 MG disintegrating tablet Take 1 tablet (4 mg total) by mouth every 8 (eight) hours as needed for nausea or vomiting. 04/08/19   Joy, Hillard Danker, PA-C      Allergies    Patient has no known allergies.    Review of Systems   Review of Systems  Physical Exam Updated Vital Signs BP (!) 139/91   Pulse 93   Temp 98.5 F (36.9 C)   Resp 20   SpO2 93%  Physical Exam Vitals and nursing note reviewed.  Constitutional:      Appearance: He is well-developed.  HENT:     Head: Normocephalic and atraumatic.     Mouth/Throat:     Comments: Poor dentition mostly along the lower mouth.  He has edema to the right lower jaw worst about the canine, I do not appreciate any obvious fluctuance.  No trismus no sublingual swelling able to tolerate  secretions without difficulty. Eyes:     Pupils: Pupils are equal, round, and reactive to light.  Neck:     Vascular: No JVD.  Cardiovascular:     Rate and Rhythm: Normal rate and regular rhythm.     Heart sounds: No murmur heard.    No friction rub. No gallop.  Pulmonary:     Effort: No respiratory distress.     Breath sounds: No wheezing.  Abdominal:     General: There is no distension.     Tenderness: There is no abdominal tenderness. There is no guarding or rebound.  Musculoskeletal:        General: Normal range of motion.     Cervical back: Normal range of motion and neck supple.  Skin:    Coloration: Skin is not pale.     Findings: No rash.  Neurological:     Mental Status: He is alert and oriented to person, place, and time.  Psychiatric:        Behavior: Behavior normal.     ED Results / Procedures / Treatments   Labs (all labs ordered are listed, but only abnormal results are displayed) Labs Reviewed - No data to display  EKG None  Radiology No  results found.  Procedures Procedures    Medications Ordered in ED Medications  clindamycin (CLEOCIN) capsule 450 mg (450 mg Oral Given 04/21/23 1139)  ketorolac (TORADOL) 15 MG/ML injection 15 mg (15 mg Intramuscular Given 04/21/23 1140)    ED Course/ Medical Decision Making/ A&P                                 Medical Decision Making Risk Prescription drug management.   52 yo M with a chief complaints of right-sided dental pain and swelling.  Expect the patient likely has an abscess though I do not appreciate any obvious area of fluctuance to attempt I&D.  I will start him on oral antibiotics.  Given our dental list.  11:45 AM:  I have discussed the diagnosis/risks/treatment options with the patient.  Evaluation and diagnostic testing in the emergency department does not suggest an emergent condition requiring admission or immediate intervention beyond what has been performed at this time.  They will follow  up with Dentistry. We also discussed returning to the ED immediately if new or worsening sx occur. We discussed the sx which are most concerning (e.g., sudden worsening pain, fever, inability to tolerate by mouth, rapid swelling, difficulty swallowing) that necessitate immediate return. Medications administered to the patient during their visit and any new prescriptions provided to the patient are listed below.  Medications given during this visit Medications  clindamycin (CLEOCIN) capsule 450 mg (450 mg Oral Given 04/21/23 1139)  ketorolac (TORADOL) 15 MG/ML injection 15 mg (15 mg Intramuscular Given 04/21/23 1140)     The patient appears reasonably screen and/or stabilized for discharge and I doubt any other medical condition or other [facility] requiring further screening, evaluation, or treatment in the ED at this time prior to discharge.          Final Clinical Impression(s) / ED Diagnoses Final diagnoses:  Dental abscess    Rx / DC Orders ED Discharge Orders     None         Melene Plan, DO 04/21/23 1145

## 2023-04-21 NOTE — ED Triage Notes (Signed)
Pt c/o dental pain x 4 days. Pt having difficulty talking r/t pain. Lower Facial swelling noted

## 2023-04-21 NOTE — ED Notes (Signed)
Discharge paperwork given and verbally understood. 

## 2023-04-21 NOTE — Discharge Instructions (Signed)
Take the antibiotics as prescribed.  Please call the numbers provided to see if you can find a dentist they can see you. Take 4 over the counter ibuprofen tablets 3 times a day or 2 over-the-counter naproxen tablets twice a day for pain. Also take tylenol 1000mg (2 extra strength) four times a day.

## 2023-08-31 ENCOUNTER — Emergency Department (HOSPITAL_COMMUNITY)
Admission: EM | Admit: 2023-08-31 | Discharge: 2023-08-31 | Disposition: A | Discharge status: Home / Self Care | Attending: Emergency Medicine | Admitting: Emergency Medicine

## 2023-08-31 ENCOUNTER — Emergency Department (HOSPITAL_COMMUNITY)

## 2023-08-31 ENCOUNTER — Ambulatory Visit (HOSPITAL_COMMUNITY)
Admission: EM | Admit: 2023-08-31 | Discharge: 2023-08-31 | Disposition: A | Discharge status: Home / Self Care | Attending: Emergency Medicine | Admitting: Emergency Medicine

## 2023-08-31 DIAGNOSIS — E119 Type 2 diabetes mellitus without complications: Secondary | ICD-10-CM | POA: Insufficient documentation

## 2023-08-31 DIAGNOSIS — Z8679 Personal history of other diseases of the circulatory system: Secondary | ICD-10-CM

## 2023-08-31 DIAGNOSIS — Z720 Tobacco use: Secondary | ICD-10-CM | POA: Diagnosis not present

## 2023-08-31 DIAGNOSIS — R4781 Slurred speech: Secondary | ICD-10-CM | POA: Diagnosis not present

## 2023-08-31 DIAGNOSIS — I10 Essential (primary) hypertension: Secondary | ICD-10-CM | POA: Insufficient documentation

## 2023-08-31 DIAGNOSIS — E1165 Type 2 diabetes mellitus with hyperglycemia: Secondary | ICD-10-CM

## 2023-08-31 DIAGNOSIS — R471 Dysarthria and anarthria: Secondary | ICD-10-CM | POA: Diagnosis present

## 2023-08-31 DIAGNOSIS — R29810 Facial weakness: Secondary | ICD-10-CM | POA: Diagnosis not present

## 2023-08-31 DIAGNOSIS — F17210 Nicotine dependence, cigarettes, uncomplicated: Secondary | ICD-10-CM

## 2023-08-31 LAB — COMPREHENSIVE METABOLIC PANEL WITH GFR
ALT: 19 U/L (ref 0–44)
AST: 16 U/L (ref 15–41)
Albumin: 3.5 g/dL (ref 3.5–5.0)
Alkaline Phosphatase: 60 U/L (ref 38–126)
Anion gap: 11 (ref 5–15)
BUN: 14 mg/dL (ref 6–20)
CO2: 24 mmol/L (ref 22–32)
Calcium: 8.8 mg/dL — ABNORMAL LOW (ref 8.9–10.3)
Chloride: 100 mmol/L (ref 98–111)
Creatinine, Ser: 0.97 mg/dL (ref 0.61–1.24)
GFR, Estimated: >60 mL/min (ref >60)
Glucose, Bld: 184 mg/dL — ABNORMAL HIGH (ref 70–99)
Potassium: 4.5 mmol/L (ref 3.5–5.1)
Sodium: 135 mmol/L (ref 135–145)
Total Bilirubin: 0.7 mg/dL (ref 0.0–1.2)
Total Protein: 6.6 g/dL (ref 6.5–8.1)

## 2023-08-31 LAB — POCT FASTING CBG KUC MANUAL ENTRY: POCT Glucose (KUC): 212 mg/dL — AB (ref 70–99)

## 2023-08-31 LAB — ETHANOL: Alcohol, Ethyl (B): <10 mg/dL (ref <10)

## 2023-08-31 LAB — CBC
HCT: 51.7 % (ref 39.0–52.0)
Hemoglobin: 17.5 g/dL — ABNORMAL HIGH (ref 13.0–17.0)
MCH: 29.9 pg (ref 26.0–34.0)
MCHC: 33.8 g/dL (ref 30.0–36.0)
MCV: 88.2 fL (ref 80.0–100.0)
Platelets: 320 10*3/uL (ref 150–400)
RBC: 5.86 MIL/uL — ABNORMAL HIGH (ref 4.22–5.81)
RDW: 13.6 % (ref 11.5–15.5)
WBC: 13.1 10*3/uL — ABNORMAL HIGH (ref 4.0–10.5)
nRBC: 0 % (ref 0.0–0.2)

## 2023-08-31 LAB — DIFFERENTIAL
Abs Immature Granulocytes: 0.09 10*3/uL — ABNORMAL HIGH (ref 0.00–0.07)
Basophils Absolute: 0.1 10*3/uL (ref 0.0–0.1)
Basophils Relative: 1 %
Eosinophils Absolute: 0.2 10*3/uL (ref 0.0–0.5)
Eosinophils Relative: 2 %
Immature Granulocytes: 1 %
Lymphocytes Relative: 19 %
Lymphs Abs: 2.5 10*3/uL (ref 0.7–4.0)
Monocytes Absolute: 0.8 10*3/uL (ref 0.1–1.0)
Monocytes Relative: 6 %
Neutro Abs: 9.4 10*3/uL — ABNORMAL HIGH (ref 1.7–7.7)
Neutrophils Relative %: 71 %

## 2023-08-31 LAB — CBG MONITORING, ED: Glucose-Capillary: 215 mg/dL — ABNORMAL HIGH (ref 70–99)

## 2023-08-31 MED ORDER — IOHEXOL 350 MG/ML SOLN
75.0000 mL | Freq: Once | INTRAVENOUS | Status: AC | PRN
  Administered 2023-08-31: 75 mL via INTRAVENOUS

## 2023-08-31 NOTE — ED Triage Notes (Signed)
 Nurse and Medical Provider notified.

## 2023-08-31 NOTE — ED Provider Notes (Signed)
 Patient presents to urgent care complaining of slurred speech which he believes began a few days ago, states his boss sent him here to get checked out.  Patient states for the the past week he has felt a little more rundown than usual.    Patient states that he smokes 2 packs of cigarettes a day.  Patient endorses a history of being diagnosed with prediabetes 2 years ago, was prescribed metformin, antihypertensive medications and took them for a while, lost some weight and began to feel better.  Patient was also provided with antihypertensive medication for elevated blood pressure, does not recall the name of it.    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  Patient states he is now no longer taking any of medications that were previously prescribed for his prediabetes and hypertension.    Of note, blood pressure is normal on arrival today and his blood sugar was 212 nonfasting.    Patient denies headache, shortness of breath, chest pain, nausea, vomiting.  Limited physical exam reveals left-sided facial drooping with otherwise normal cranial nerves with abnormal pronator drift on the left, patient ambulating independently without assistance without any difficulty.  Speech is slightly slurred but intelligible.  Patient is morbidly obese and seems to be in no acute distress.  Given patient's medical history with multiple comorbidities and high risk for stroke, recommend patient go to the emergency room for further evaluation.Eloise Hake Scales, PA-C 08/31/23 1152

## 2023-08-31 NOTE — ED Notes (Signed)
 Report given to Abraham Hoffmann, CN at [facility].

## 2023-08-31 NOTE — ED Triage Notes (Signed)
 Pt present with slurred speech, onset a few days ago. Ptstates hx of cardiac issue. Pt also states speech has slowed down. States he feels he can sleep all day in bed, is tired and it only happens when getting out of bed.

## 2023-08-31 NOTE — Discharge Instructions (Signed)
 Make an appointment to follow-up with your primary care doctor.  Return to the emergency room if you have any worsening symptoms.

## 2023-08-31 NOTE — ED Provider Notes (Signed)
 [place] EMERGENCY DEPARTMENT AT [facility] Provider Note   CSN: [id number] Arrival date & time: 08/31/23  1232     History  Chief Complaint  Patient presents with   Aphasia    Shawn Patrick is a 53 y.o. male.  53 year old male diabetes, hypertension, and tobacco use who presents to the emergency department with dysarthria.  Patient reports that on Monday or Tuesday he started having some slurred speech.  Denies any other symptoms including vision changes, difficulty swallowing, facial droop, numbness or weakness of his arms or legs.  Does not have a history of stroke.  Says he is not having any mouth pain or swelling.  Went to urgent care and transferred him here for stroke evaluation.  They thought that he might have some facial droop.       Home Medications Prior to Admission medications   Medication Sig Start Date End Date Taking? Authorizing Provider  acetaminophen  (TYLENOL ) 500 MG tablet Take 500 mg by mouth every 6 (six) hours as needed for mild pain (pain score 1-3).   Yes [provider]  ibuprofen  (ADVIL ) 200 MG tablet Take 200 mg by mouth every 6 (six) hours as needed for moderate pain (pain score 4-6).   Yes [provider]      Allergies    Patient has no known allergies.    Review of Systems   Review of Systems  Physical Exam Updated Vital Signs BP 133/73 (BP Location: Right Arm)   Pulse 70   Temp 97.8 F (36.6 C) (Oral)   Resp 15   SpO2 96%  Physical Exam Vitals and nursing note reviewed.  Constitutional:      General: He is not in acute distress.    Appearance: He is well-developed.  HENT:     Head: Normocephalic and atraumatic.     Right Ear: External ear normal.     Left Ear: External ear normal.     Nose: Nose normal.     Mouth/Throat:     Comments: No abnormalities in his oropharynx. Eyes:     Extraocular Movements: Extraocular movements intact.     Conjunctiva/sclera: Conjunctivae normal.     Pupils:  Pupils are equal, round, and reactive to light.  Cardiovascular:     Rate and Rhythm: Normal rate and regular rhythm.     Heart sounds: Normal heart sounds.  Pulmonary:     Effort: Pulmonary effort is normal. No respiratory distress.     Breath sounds: Normal breath sounds.  Musculoskeletal:     Cervical back: Normal range of motion and neck supple.     Right lower leg: No edema.     Left lower leg: No edema.  Skin:    General: Skin is warm and dry.  Neurological:     Mental Status: He is alert. Mental status is at baseline.     Comments: NIHSS Exam  Level of Consciousness: Alert  LOC Questions: Answers Month and Age Correctly  LOC Commands: Opens and Closes Eyes and Hands on command  Best Gaze: Horizontal ocular movements intact  Visual Fields: No visual field loss  Facial Palsy: None  L Upper Extremity Motor: No drift after 10 seconds  R Upper Extremity Motor: No drift after 10 seconds  L Lower extremity Motor: No drift after 5 seconds  R Lower extremity Motor: No drift after 5 seconds  Ataxia: Absent  Sensory: Intact sensation to light touch on face, arms, trunk, and legs bilaterally  Best  Language: No aphasia  Dysarthria: Dysarthria present Neglect: No visual or sensory neglect    Psychiatric:        Mood and Affect: Mood normal.        Behavior: Behavior normal.     ED Results / Procedures / Treatments   Labs (all labs ordered are listed, but only abnormal results are displayed) Labs Reviewed  CBC - Abnormal; Notable for the following components:      Result Value   WBC 13.1 (*)    RBC 5.86 (*)    Hemoglobin 17.5 (*)    All other components within normal limits  DIFFERENTIAL - Abnormal; Notable for the following components:   Neutro Abs 9.4 (*)    Abs Immature Granulocytes 0.09 (*)    All other components within normal limits  COMPREHENSIVE METABOLIC PANEL WITH GFR - Abnormal; Notable for the following components:   Glucose, Bld 184 (*)    Calcium  8.8 (*)     All other components within normal limits  CBG MONITORING, ED - Abnormal; Notable for the following components:   Glucose-Capillary 215 (*)    All other components within normal limits  ETHANOL    EKG EKG Interpretation Date/Time:  Thursday August 31 2023 12:49:36 EDT Ventricular Rate:  72 PR Interval:  144 QRS Duration:  94 QT Interval:  386 QTC Calculation: 422 R Axis:   42  Text Interpretation: Normal sinus rhythm Cannot rule out Anterior infarct , age undetermined Abnormal ECG When compared with ECG of 21-Mar-2016 07:56, PREVIOUS ECG IS PRESENT Confirmed by Shyrl Doyne [phone]) on 08/31/2023 2:03:27 PM  Radiology CT ANGIO HEAD NECK W WO CM Result Date: 08/31/2023 CLINICAL DATA:  Provided history: Dysarthria. EXAM: CT ANGIOGRAPHY HEAD AND NECK WITH AND WITHOUT CONTRAST TECHNIQUE: Multidetector CT imaging of the head and neck was performed using the standard protocol during bolus administration of intravenous contrast. Multiplanar CT image reconstructions and MIPs were obtained to evaluate the vascular anatomy. Carotid stenosis measurements (when applicable) are obtained utilizing NASCET criteria, using the distal internal carotid diameter as the denominator. RADIATION DOSE REDUCTION: This exam was performed according to the departmental dose-optimization program which includes automated exposure control, adjustment of the mA and/or kV according to patient size and/or use of iterative reconstruction technique. CONTRAST:  75mL OMNIPAQUE  IOHEXOL  350 MG/ML SOLN COMPARISON:  Brain MRI 08/31/2023. FINDINGS: CTA NECK FINDINGS Aortic arch: Common origin of the innominate and left common carotid arteries. Minimal atherosclerotic plaque within the visualized thoracic aorta. Streak/beam hardening artifact arising from a dense contrast bolus partially obscures the left subclavian artery. Within this limitation, there is no appreciable hemodynamically significant innominate or proximal subclavian  artery stenosis. Right carotid system: CCA and ICA patent within the neck without measurable stenosis. Mild atherosclerotic plaque within the proximal ICA. Left carotid system: CCA and ICA patent within the neck without stenosis. Mild atherosclerotic plaque about the carotid bifurcation and within the proximal ICA. Vertebral arteries: Codominant and patent within the neck without stenosis or significant atherosclerotic disease. Skeleton: Poor dentition. Spondylosis of the cervical and visualized upper thoracic levels. Other neck: No neck mass or cervical lymphadenopathy. Upper chest: No consolidation within the imaged lung apices. Review of the MIP images confirms the above findings CTA HEAD FINDINGS Anterior circulation: The intracranial internal carotid arteries are patent. Atherosclerotic plaque within both vessels with no more than mild stenosis. The M1 middle cerebral arteries are patent. Atherosclerotic irregularity of the M2 and more distal MCA vessels, bilaterally. No M2 proximal branch occlusion  or high-grade proximal stenosis is identified. The anterior cerebral arteries are patent. Atherosclerotic irregularity of both vessels. Most notably, there is a mild-to-moderate stenosis of the right ACA A1 segment. Posterior circulation: The intracranial vertebral arteries are patent. The basilar artery is patent. The posterior cerebral arteries are patent. Posterior communicating arteries are diminutive or absent, bilaterally. Venous sinuses: Within the limitations of contrast timing, no convincing thrombus. Anatomic variants: As described. Review of the MIP images confirms the above findings IMPRESSION: CTA neck: 1. The common carotid and internal carotid arteries are patent within the neck without stenosis. Mild atherosclerotic plaque bilaterally, as described. 2. The vertebral arteries are patent within the neck without stenosis or significant atherosclerotic disease. 3. Aortic Atherosclerosis (ICD10-I70.0).  CTA head: Intracranial atherosclerotic disease as described. No proximal intracranial large vessel occlusion or high-grade proximal arterial stenosis identified. Electronically Signed   By: Bascom Lily D.O.   On: 08/31/2023 17:48   MR BRAIN WO CONTRAST Result Date: 08/31/2023 CLINICAL DATA:  Provided history: Slurred speech. EXAM: MRI HEAD WITHOUT CONTRAST TECHNIQUE: Multiplanar, multiecho pulse sequences of the brain and surrounding structures were obtained without intravenous contrast. COMPARISON:  None. FINDINGS: Intermittently motion degraded examination. Most notably, the sagittal T1-weighted sequence is moderate-to-severely motion degraded and the axial SWI sequence is moderately motion degraded. Within this limitation, findings are as follows. Brain: No age-advanced or lobar predominant cerebral atrophy. Chronic infarct within the callosal body. Mild multifocal T2 FLAIR hyperintense signal abnormality elsewhere within the cerebral white matter, nonspecific but most often secondary to chronic small vessel ischemia. No cortical encephalomalacia is identified. There is no acute infarct. No evidence of an intracranial mass. No chronic intracranial blood products. No extra-axial fluid collection. No midline shift. Vascular: Maintained flow voids within the proximal large arterial vessels. Skull and upper cervical spine: No focal worrisome marrow lesion. Sinuses/Orbits: No mass or acute finding within the imaged orbits. Small mucous retention cyst within the right maxillary sinus. IMPRESSION: 1. Intermittently motion degraded examination. Within this limitation, findings are as follows. 2. No evidence of an acute intracranial abnormality. The diffusion-weighted imaging is of good quality and there is no evidence of an acute infarct. 3. Chronic infarct within the callosal body. 4. Mild T2 FLAIR hyperintense signal changes elsewhere within the cerebral white matter, nonspecific but most often secondary to chronic  small vessel ischemia. 5. Small right maxillary sinus mucous retention cyst. Electronically Signed   By: Bascom Lily D.O.   On: 08/31/2023 16:59    Procedures Procedures    Medications Ordered in ED Medications  iohexol  (OMNIPAQUE ) 350 MG/ML injection 75 mL (75 mLs Intravenous Contrast Given 08/31/23 1540)    ED Course/ Medical Decision Making/ A&P Clinical Course as of 09/01/23 0951  Thu Aug 31, 2023  1649 Signed to Dr Martina Sledge. [RP]    Clinical Course User Index [RP] Ninetta Basket, MD                                 Medical Decision Making Amount and/or Complexity of Data Reviewed Labs: ordered. Radiology: ordered.  Risk Prescription drug management.   ANTERO DEROSIA is a 53 y.o. male with comorbidities that complicate the patient evaluation including diabetes, hypertension, and tobacco use who presents to the emergency department with dysarthria.   Initial Ddx:  Stroke, dysarthria, infection, ICH, hypoglycemia  MDM/Course:  Patient presents emergency department dysarthria for several days.  No other neurologic complaints.  Was seen  in urgent care and was concern for stroke but is outside of the window for any sort of activation.  On my evaluation has dysarthria.  No apparent oropharyngeal pathology to explain it.  Protecting his airway at this time.  No other neurologic deficits on my NIHSS.  With his risk factors will obtain stroke imaging at this time.  Signed out to the oncoming physician awaiting the results of his imaging.  Of note, patient did have to go to the waiting room due to boarding issues and lack of available beds.    This patient presents to the ED for concern of complaints listed in HPI, this involves an extensive number of treatment options, and is a complaint that carries with it a high risk of complications and morbidity. Disposition including potential need for admission considered.   Dispo: Pending remainder of workup  Additional history obtained  from EMS Records reviewed Outpatient Clinic Notes The following labs were independently interpreted: Chemistry and show no acute abnormality I personally reviewed and interpreted the pt's EKG: see above for interpretation  I have reviewed the patients home medications and made adjustments as needed  Portions of this note were generated with Dragon dictation software. Dictation errors may occur despite best attempts at proofreading.     Final Clinical Impression(s) / ED Diagnoses Final diagnoses:  Slurred speech    Rx / DC Orders ED Discharge Orders     None         Ninetta Basket, MD 09/01/23 [phone]

## 2023-08-31 NOTE — ED Notes (Signed)
 Patient is being discharged from the Urgent Care and sent to the Emergency Department via [facility]. Per Lindsay,PA , patient is in need of higher level of care due to slurred speech and facial droop x2 days. Patient is aware and verbalizes understanding of plan of care.  Vitals:   08/31/23 1124 08/31/23 1126  BP: 123/89   Pulse: 78   SpO2: 95% 98%

## 2023-08-31 NOTE — ED Provider Triage Note (Signed)
 Emergency Medicine Provider Triage Evaluation Note  Shawn Patrick , a 53 y.o. male  was evaluated in triage.  Pt complains of slurred speech since Monday or Tuesday.  Review of Systems  Positive:  Negative:   Physical Exam  BP 129/88 (BP Location: Right Arm)   Pulse 79   Temp 98.8 F (37.1 C) (Oral)   Resp 16   SpO2 95%  Gen:   Awake, no distress   Resp:  Normal effort  MSK:   Moves extremities without difficulty  Other:  Oropharynx clear  NIHSS Exam  Level of Consciousness: Alert  LOC Questions: Answers Month and Age Correctly  LOC Commands: Opens and Closes Eyes and Hands on command  Best Gaze: Horizontal ocular movements intact  Visual Fields: No visual field loss  Facial Palsy: None  L Upper Extremity Motor: No drift after 10 seconds  R Upper Extremity Motor: No drift after 10 seconds  L Lower extremity Motor: No drift after 5 seconds  R Lower extremity Motor: No drift after 5 seconds  Ataxia: Absent  Sensory: Intact sensation to light touch on face, arms, trunk, and legs bilaterally  Best Language: No aphasia  Dysarthria: Dysarthria present  Neglect: No visual or sensory neglect    Medical Decision Making  Medically screening exam initiated at 12:43 PM.  Appropriate orders placed.  Shawn Patrick was informed that the remainder of the evaluation will be completed by another provider, this initial triage assessment does not replace that evaluation, and the importance of remaining in the ED until their evaluation is complete.   Shawn Basket, MD 08/31/23 [phone]

## 2023-08-31 NOTE — ED Provider Notes (Signed)
 Care was taken over from Dr. Efraim Grange.  Patient presents with slurred speech.  He says that he does not really notice it but someone at work pointed out.  He says that sometimes he just does not enunciate very well and feels like his speech is pretty much normal for him.  He does not report any other deficits.  He has 5 out of 5 motor strength in all extremities.  Sensation is intact.  No facial drooping.  He does not feel any swelling of his tongue or lips.  There is no dental pain or concerns for oral infection.  He had an MRI of his brain which does not show any acute abnormality.  CTA of his head does not show any critical stenosis.  Labs reviewed and are nonconcerning.  He said he has been feeling fatigued and I advised him that we were waiting to check a urinalysis although he says he is ready to go and does not want to wait for any further testing.  He speaks much more clearly when he sits up and is awake.  When he lays down or is sleepy, he sounds more slurred.  He says he feels like he just does not enunciate his words but this is pretty much how he always sounds.  He does not want to stay in the hospital for any further testing or evaluation.  He was discharged home in good condition.  Return precautions were given.  He will follow-up at the [facility] where he has previously been seen.  I did advise him that he does have some atherosclerosis and needs to have a physical to check his blood pressure again and his cholesterol.  His glucose was also mildly elevated.     Hershel Los, MD 08/31/23 [phone]

## 2023-08-31 NOTE — ED Triage Notes (Signed)
 Pt arrives via [facility] from [facility]. Pt states that sometime on Monday or Tuesday he began having slurred speech. [facility] reports that UC noticed some facial droop, but none appreciated during triage. Pt without unilateral weakness or sensation changes.

## 2023-09-06 ENCOUNTER — Ambulatory Visit: Admitting: Pharmacist

## 2023-09-07 ENCOUNTER — Encounter: Payer: Self-pay | Admitting: Nurse Practitioner

## 2023-09-07 ENCOUNTER — Ambulatory Visit: Admitting: Nurse Practitioner

## 2023-09-07 VITALS — BP 138/114 | HR 82 | Temp 98.8°F | Wt 194.4 lb

## 2023-09-07 DIAGNOSIS — F419 Anxiety disorder, unspecified: Secondary | ICD-10-CM

## 2023-09-07 DIAGNOSIS — R4781 Slurred speech: Secondary | ICD-10-CM | POA: Diagnosis not present

## 2023-09-07 DIAGNOSIS — Z1329 Encounter for screening for other suspected endocrine disorder: Secondary | ICD-10-CM

## 2023-09-07 DIAGNOSIS — I700 Atherosclerosis of aorta: Secondary | ICD-10-CM

## 2023-09-07 DIAGNOSIS — E118 Type 2 diabetes mellitus with unspecified complications: Secondary | ICD-10-CM

## 2023-09-07 DIAGNOSIS — Z716 Tobacco abuse counseling: Secondary | ICD-10-CM

## 2023-09-07 DIAGNOSIS — F32A Depression, unspecified: Secondary | ICD-10-CM

## 2023-09-07 DIAGNOSIS — Z13220 Encounter for screening for lipoid disorders: Secondary | ICD-10-CM

## 2023-09-07 LAB — POCT GLYCOSYLATED HEMOGLOBIN (HGB A1C): Hemoglobin A1C: 10.1 % — AB (ref 4.0–5.6)

## 2023-09-07 MED ORDER — METFORMIN HCL ER 500 MG PO TB24: 500.0000 mg | ORAL_TABLET | Freq: Two times a day (BID) | ORAL | 2 refills | Status: AC

## 2023-09-07 MED ORDER — BUPROPION HCL ER (SR) 150 MG PO TB12: 150.0000 mg | ORAL_TABLET | Freq: Two times a day (BID) | ORAL | 2 refills | Status: AC

## 2023-09-07 MED ORDER — ATORVASTATIN CALCIUM 20 MG PO TABS: 20.0000 mg | ORAL_TABLET | Freq: Every day | ORAL | 3 refills | Status: AC

## 2023-09-07 NOTE — Progress Notes (Signed)
 Subjective   Patient ID: Shawn Patrick, male    DOB: [birth year], 53 y.o.   MRN: [id number]  Chief Complaint  Patient presents with   Follow-up    Referring provider: Lawrance Presume, MD  Shawn Patrick is a 53 y.o. male with Past Medical History: No date: Diabetes mellitus without complication (HCC) No date: Hypertension No date: Obesity   HPI  Patient presents today for an ED follow-up.  He was seen in the ED on 08/31/2023 for slurred speech.  He did have a CTA of his head that did not show any critical issues.  He did have MRI of the brain that showed no acute abnormality.  His speech continues to be slurred.  We will place a referral for him to neurology.  We will also place a referral to cardiology because scans did show aortic atherosclerosis.  Patient would like to quit smoking and also has anxiety and depression so we will order Wellbutrin  for him today. Denies f/c/s, n/v/d, hemoptysis, PND, leg swelling Denies chest pain or edema     No Known Allergies  Immunization History  Administered Date(s) Administered   Influenza,inj,Quad PF,6+ Mos 06/02/2017   Pneumococcal Polysaccharide-23 06/02/2017   Tdap 04/08/2019    Tobacco History: Social History   Tobacco Use  Smoking Status Every Day   Current packs/day: 2.00   Types: Cigarettes  Smokeless Tobacco Never   Ready to quit: Yes Counseling given: Yes   Outpatient Encounter Medications as of 09/07/2023  Medication Sig   acetaminophen  (TYLENOL ) 500 MG tablet Take 500 mg by mouth every 6 (six) hours as needed for mild pain (pain score 1-3).   atorvastatin  (LIPITOR) 20 MG tablet Take 1 tablet (20 mg total) by mouth daily.   buPROPion  (WELLBUTRIN  SR) 150 MG 12 hr tablet Take 1 tablet (150 mg total) by mouth 2 (two) times daily.   ibuprofen  (ADVIL ) 200 MG tablet Take 200 mg by mouth every 6 (six) hours as needed for moderate pain (pain score 4-6).   metFORMIN  (GLUCOPHAGE -XR) 500 MG 24 hr tablet Take 1 tablet  (500 mg total) by mouth 2 (two) times daily with a meal.   No facility-administered encounter medications on file as of 09/07/2023.    Review of Systems  Review of Systems  Constitutional: Negative.   HENT: Negative.    Cardiovascular: Negative.   Gastrointestinal: Negative.   Allergic/Immunologic: Negative.   Neurological: Negative.   Psychiatric/Behavioral: Negative.       Objective:   BP (!) 138/114   Pulse 82   Temp 98.8 F (37.1 C) (Oral)   Wt 194 lb 6.4 oz (88.2 kg)   SpO2 95%   BMI 27.89 kg/m   Wt Readings from Last 5 Encounters:  09/07/23 194 lb 6.4 oz (88.2 kg)  12/07/21 250 lb (113.4 kg)  12/31/20 250 lb (113.4 kg)  01/06/20 235 lb (106.6 kg)  04/08/19 250 lb (113.4 kg)     Physical Exam Vitals and nursing note reviewed.  Constitutional:      General: He is not in acute distress.    Appearance: He is well-developed.  Cardiovascular:     Rate and Rhythm: Normal rate and regular rhythm.  Pulmonary:     Effort: Pulmonary effort is normal.     Breath sounds: Normal breath sounds.  Skin:    General: Skin is warm and dry.  Neurological:     Mental Status: He is alert and oriented to person, place, and time.  Assessment & Plan:   Slurred speech -     Ambulatory referral to Neurology  Controlled type 2 diabetes mellitus with complication, without long-term current use of insulin  (HCC) -     POCT glycosylated hemoglobin (Hb A1C) -     Microalbumin / creatinine urine ratio -     CBC -     Comprehensive metabolic panel with GFR -     Atorvastatin  Calcium ; Take 1 tablet (20 mg total) by mouth daily.  Dispense: 90 tablet; Refill: 3 -     metFORMIN  HCl ER; Take 1 tablet (500 mg total) by mouth 2 (two) times daily with a meal.  Dispense: 60 tablet; Refill: 2  Aortic atherosclerosis (HCC) -     Atorvastatin  Calcium ; Take 1 tablet (20 mg total) by mouth daily.  Dispense: 90 tablet; Refill: 3 -     Ambulatory referral to Cardiology  Thyroid  disorder screen -     TSH  Lipid screening -     Lipid panel  Anxiety and depression -     buPROPion  HCl ER (SR); Take 1 tablet (150 mg total) by mouth 2 (two) times daily.  Dispense: 60 tablet; Refill: 2  Encounter for smoking cessation counseling -     buPROPion  HCl ER (SR); Take 1 tablet (150 mg total) by mouth 2 (two) times daily.  Dispense: 60 tablet; Refill: 2     Return in about 4 weeks (around 10/05/2023) for hypertension / diabetes.   Jerrlyn Morel, NP 09/13/2023

## 2023-09-08 ENCOUNTER — Other Ambulatory Visit: Payer: Self-pay | Admitting: Nurse Practitioner

## 2023-09-08 DIAGNOSIS — R7989 Other specified abnormal findings of blood chemistry: Secondary | ICD-10-CM

## 2023-09-08 DIAGNOSIS — D72829 Elevated white blood cell count, unspecified: Secondary | ICD-10-CM

## 2023-09-08 LAB — COMPREHENSIVE METABOLIC PANEL WITH GFR
ALT: 17 IU/L (ref 0–44)
AST: 10 IU/L (ref 0–40)
Albumin: 4.3 g/dL (ref 3.8–4.9)
Alkaline Phosphatase: 96 IU/L (ref 44–121)
BUN/Creatinine Ratio: 10 (ref 9–20)
BUN: 11 mg/dL (ref 6–24)
Bilirubin Total: 0.6 mg/dL (ref 0.0–1.2)
CO2: 26 mmol/L (ref 20–29)
Calcium: 9.5 mg/dL (ref 8.7–10.2)
Chloride: 96 mmol/L (ref 96–106)
Creatinine, Ser: 1.06 mg/dL (ref 0.76–1.27)
Globulin, Total: 2.6 g/dL (ref 1.5–4.5)
Glucose: 179 mg/dL — ABNORMAL HIGH (ref 70–99)
Potassium: 5.2 mmol/L (ref 3.5–5.2)
Sodium: 137 mmol/L (ref 134–144)
Total Protein: 6.9 g/dL (ref 6.0–8.5)
eGFR: 84 mL/min/{1.73_m2} (ref >59)

## 2023-09-08 LAB — CBC
Hematocrit: 53.9 % — ABNORMAL HIGH (ref 37.5–51.0)
Hemoglobin: 18.7 g/dL — ABNORMAL HIGH (ref 13.0–17.7)
MCH: 30.2 pg (ref 26.6–33.0)
MCHC: 34.7 g/dL (ref 31.5–35.7)
MCV: 87 fL (ref 79–97)
Platelets: 310 10*3/uL (ref 150–450)
RBC: 6.19 x10E6/uL — ABNORMAL HIGH (ref 4.14–5.80)
RDW: 12.9 % (ref 11.6–15.4)
WBC: 13 10*3/uL — ABNORMAL HIGH (ref 3.4–10.8)

## 2023-09-08 LAB — LIPID PANEL
Chol/HDL Ratio: 7.7 ratio — ABNORMAL HIGH (ref 0.0–5.0)
Cholesterol, Total: 316 mg/dL — ABNORMAL HIGH (ref 100–199)
HDL: 41 mg/dL (ref >39)
LDL Chol Calc (NIH): 217 mg/dL — ABNORMAL HIGH (ref 0–99)
Triglycerides: 284 mg/dL — ABNORMAL HIGH (ref 0–149)
VLDL Cholesterol Cal: 58 mg/dL — ABNORMAL HIGH (ref 5–40)

## 2023-09-08 LAB — MICROALBUMIN / CREATININE URINE RATIO
Creatinine, Urine: 264.4 mg/dL
Microalb/Creat Ratio: 20 mg/g{creat} (ref 0–29)
Microalbumin, Urine: 52.6 ug/mL

## 2023-09-08 LAB — TSH: TSH: 6.72 u[IU]/mL — ABNORMAL HIGH (ref 0.450–4.500)

## 2023-09-13 NOTE — Patient Instructions (Signed)
 1. Controlled type 2 diabetes mellitus with complication, without long-term current use of insulin  (HCC)  - POCT glycosylated hemoglobin (Hb A1C) - Urine Albumin/Creatinine with ratio (send out) [LAB689] - CBC - Comprehensive metabolic panel with GFR - atorvastatin  (LIPITOR) 20 MG tablet; Take 1 tablet (20 mg total) by mouth daily.  Dispense: 90 tablet; Refill: 3 - metFORMIN  (GLUCOPHAGE -XR) 500 MG 24 hr tablet; Take 1 tablet (500 mg total) by mouth 2 (two) times daily with a meal.  Dispense: 60 tablet; Refill: 2  2. Slurred speech (Primary)  - Ambulatory referral to Neurology  3. Aortic atherosclerosis (HCC)  - atorvastatin  (LIPITOR) 20 MG tablet; Take 1 tablet (20 mg total) by mouth daily.  Dispense: 90 tablet; Refill: 3 - Ambulatory referral to Cardiology  4. Thyroid disorder screen  - TSH  5. Lipid screening  - Lipid Panel  6. Anxiety and depression  - buPROPion  (WELLBUTRIN  SR) 150 MG 12 hr tablet; Take 1 tablet (150 mg total) by mouth 2 (two) times daily.  Dispense: 60 tablet; Refill: 2  7. Encounter for smoking cessation counseling  - buPROPion  (WELLBUTRIN  SR) 150 MG 12 hr tablet; Take 1 tablet (150 mg total) by mouth 2 (two) times daily.  Dispense: 60 tablet; Refill: 2

## 2023-10-05 ENCOUNTER — Ambulatory Visit: Admitting: Nurse Practitioner

## 2023-10-23 ENCOUNTER — Inpatient Hospital Stay

## 2023-10-23 ENCOUNTER — Inpatient Hospital Stay: Attending: Internal Medicine | Admitting: Internal Medicine

## 2023-10-23 ENCOUNTER — Other Ambulatory Visit: Payer: Self-pay

## 2023-10-23 DIAGNOSIS — D72829 Elevated white blood cell count, unspecified: Secondary | ICD-10-CM

## 2024-01-06 ENCOUNTER — Ambulatory Visit (HOSPITAL_COMMUNITY)
Admission: EM | Admit: 2024-01-06 | Discharge: 2024-01-06 | Disposition: A | Discharge status: Home / Self Care | Attending: Physician Assistant | Admitting: Physician Assistant

## 2024-01-06 ENCOUNTER — Encounter (HOSPITAL_COMMUNITY): Payer: Self-pay

## 2024-01-06 DIAGNOSIS — K047 Periapical abscess without sinus: Secondary | ICD-10-CM

## 2024-01-06 HISTORY — DX: Pure hypercholesterolemia, unspecified: E78.00

## 2024-01-06 MED ORDER — KETOROLAC TROMETHAMINE 30 MG/ML IJ SOLN
INTRAMUSCULAR | Status: AC
  Filled 2024-01-06: qty 1

## 2024-01-06 MED ORDER — AMOXICILLIN-POT CLAVULANATE 875-125 MG PO TABS: 1.0000 | ORAL_TABLET | Freq: Two times a day (BID) | ORAL | 0 refills | Status: AC

## 2024-01-06 MED ORDER — KETOROLAC TROMETHAMINE 30 MG/ML IJ SOLN
30.0000 mg | Freq: Once | INTRAMUSCULAR | Status: AC
  Administered 2024-01-06: 30 mg via INTRAMUSCULAR

## 2024-01-06 NOTE — Discharge Instructions (Signed)
 Take antibiotic as prescribed. Can take Tylenol  and ibuprofen  as needed for pain. Recommend cool compress to face. Go to the emergency department immediately if you develop worsening symptoms like fever, chills, nausea, vomiting or worsening pain.

## 2024-01-06 NOTE — ED Notes (Signed)
 CALLED PATIENT FROM WAITING AREA X 2.

## 2024-01-06 NOTE — ED Triage Notes (Signed)
 Patient reports that he has had left upper dental pain with facial swelling x 2 days. Patient states swelling worsened last night.  Patient took Tylenol  yesterday only for pain.

## 2024-01-06 NOTE — ED Provider Notes (Signed)
 [facility]    CSN: [id number] Arrival date & time: 01/06/24  1004      History   Chief Complaint No chief complaint on file.   HPI Shawn Patrick is a 53 y.o. male.   Patient reports of left upper dental pain and facial swelling that started about 2 days ago.  Patient has poor dentition throughout.  He has not had dental care in many many years.  He has several broken teeth.  Denies fever, chills, nausea, vomiting.  He has tried nothing for the symptoms.    Past Medical History:  Diagnosis Date   Diabetes mellitus without complication (HCC)    High cholesterol    Hypertension    Obesity     Patient Active Problem List   Diagnosis Date Noted   Controlled type 2 diabetes mellitus with complication, without long-term current use of insulin  (HCC) 07/10/2017   Hyperlipidemia 07/10/2017   Snoring 07/10/2017   Obesity (BMI 35.0-39.9 without comorbidity) 07/10/2017    History reviewed. No pertinent surgical history.     Home Medications    Prior to Admission medications   Medication Sig Start Date End Date Taking? Authorizing Provider  amoxicillin -clavulanate (AUGMENTIN ) 875-125 MG tablet Take 1 tablet by mouth 2 (two) times daily for 7 days. 01/06/24 01/13/24 Yes Ward, Harlene PEDLAR, PA-C  acetaminophen  (TYLENOL ) 500 MG tablet Take 500 mg by mouth every 6 (six) hours as needed for mild pain (pain score 1-3).    [provider]  atorvastatin  (LIPITOR) 20 MG tablet Take 1 tablet (20 mg total) by mouth daily. Patient not taking: Reported on 01/06/2024 09/07/23   Oley Bascom RAMAN, NP  buPROPion  (WELLBUTRIN  SR) 150 MG 12 hr tablet Take 1 tablet (150 mg total) by mouth 2 (two) times daily. Patient not taking: Reported on 01/06/2024 09/07/23   Oley Bascom RAMAN, NP  ibuprofen  (ADVIL ) 200 MG tablet Take 200 mg by mouth every 6 (six) hours as needed for moderate pain (pain score 4-6).    [provider]  metFORMIN  (GLUCOPHAGE -XR) 500 MG 24 hr tablet Take 1  tablet (500 mg total) by mouth 2 (two) times daily with a meal. Patient not taking: Reported on 01/06/2024 09/07/23   Oley Bascom RAMAN, NP    Family History Family History  Problem Relation Age of Onset   Heart failure Father    Hypertension Other     Social History Social History   Tobacco Use   Smoking status: Every Day    Current packs/day: 2.00    Types: Cigarettes   Smokeless tobacco: Never  Vaping Use   Vaping status: Never Used  Substance Use Topics   Alcohol use: No   Drug use: No    Comment: h/o smoking marijuana quit     Allergies   Patient has no known allergies.   Review of Systems Review of Systems  Constitutional:  Negative for chills and fever.  HENT:  Positive for dental problem. Negative for ear pain and sore throat.   Eyes:  Negative for pain and visual disturbance.  Respiratory:  Negative for cough and shortness of breath.   Cardiovascular:  Negative for chest pain and palpitations.  Gastrointestinal:  Negative for abdominal pain and vomiting.  Genitourinary:  Negative for dysuria and hematuria.  Musculoskeletal:  Negative for arthralgias and back pain.  Skin:  Negative for color change and rash.  Neurological:  Negative for seizures and syncope.  All other systems reviewed and are negative.  Physical Exam Triage Vital Signs ED Triage Vitals  Encounter Vitals Group     BP 01/06/24 1026 (!) 142/91     Girls Systolic BP Percentile --      Girls Diastolic BP Percentile --      Boys Systolic BP Percentile --      Boys Diastolic BP Percentile --      Pulse Rate 01/06/24 1026 71     Resp 01/06/24 1026 16     Temp 01/06/24 1026 97.9 F (36.6 C)     Temp Source 01/06/24 1026 Oral     SpO2 01/06/24 1026 90 %     Weight --      Height --      Head Circumference --      Peak Flow --      Pain Score 01/06/24 1027 7     Pain Loc --      Pain Education --      Exclude from Growth Chart --    No data found.  Updated Vital Signs BP (!)  142/91 (BP Location: Left Arm)   Pulse 71   Temp 97.9 F (36.6 C) (Oral)   Resp 16   SpO2 90%   Visual Acuity Right Eye Distance:   Left Eye Distance:   Bilateral Distance:    Right Eye Near:   Left Eye Near:    Bilateral Near:     Physical Exam Vitals and nursing note reviewed.  Constitutional:      General: He is not in acute distress.    Appearance: He is well-developed.  HENT:     Head: Normocephalic and atraumatic.      Mouth/Throat:     Dentition: Abnormal dentition. Dental caries present.  Eyes:     Conjunctiva/sclera: Conjunctivae normal.  Cardiovascular:     Rate and Rhythm: Normal rate and regular rhythm.     Heart sounds: No murmur heard. Pulmonary:     Effort: Pulmonary effort is normal. No respiratory distress.     Breath sounds: Normal breath sounds.  Abdominal:     Palpations: Abdomen is soft.     Tenderness: There is no abdominal tenderness.  Musculoskeletal:        General: No swelling.     Cervical back: Neck supple.  Skin:    General: Skin is warm and dry.     Capillary Refill: Capillary refill takes less than 2 seconds.  Neurological:     Mental Status: He is alert.  Psychiatric:        Mood and Affect: Mood normal.      UC Treatments / Results  Labs (all labs ordered are listed, but only abnormal results are displayed) Labs Reviewed - No data to display  EKG   Radiology No results found.  Procedures Procedures (including critical care time)  Medications Ordered in UC Medications  ketorolac  (TORADOL ) 30 MG/ML injection 30 mg (30 mg Intramuscular Given 01/06/24 1039)    Initial Impression / Assessment and Plan / UC Course  I have reviewed the triage vital signs and the nursing notes.  Pertinent labs & imaging results that were available during my care of the patient were reviewed by me and considered in my medical decision making (see chart for details).     Will treat for dental abscess with antibiotic.  Toradol  given in  clinic today.  Advised to follow-up with dentist as soon as possible.  Dental resources given.  ED precautions given. Final Clinical Impressions(s) /  UC Diagnoses   Final diagnoses:  Dental abscess     Discharge Instructions      Take antibiotic as prescribed. Can take Tylenol  and ibuprofen  as needed for pain. Recommend cool compress to face. Go to the emergency department immediately if you develop worsening symptoms like fever, chills, nausea, vomiting or worsening pain.   ED Prescriptions     Medication Sig Dispense Auth. Provider   amoxicillin -clavulanate (AUGMENTIN ) 875-125 MG tablet Take 1 tablet by mouth 2 (two) times daily for 7 days. 14 tablet Ward, Bernice Mcauliffe Z, PA-C      PDMP not reviewed this encounter.   Ward, Harlene PEDLAR, PA-C 01/06/24 1043
